# Patient Record
Sex: Female | Born: 1965 | Race: White | Hispanic: No | Smoking: Never smoker
Health system: Southern US, Community
[De-identification: ages and names within clinical notes are randomized; demographics above are authoritative.]

## PROBLEM LIST (undated history)

## (undated) DIAGNOSIS — T8859XA Other complications of anesthesia, initial encounter: Secondary | ICD-10-CM

## (undated) DIAGNOSIS — N649 Disorder of breast, unspecified: Secondary | ICD-10-CM

## (undated) DIAGNOSIS — Z9221 Personal history of antineoplastic chemotherapy: Secondary | ICD-10-CM

## (undated) DIAGNOSIS — Z923 Personal history of irradiation: Secondary | ICD-10-CM

## (undated) DIAGNOSIS — E079 Disorder of thyroid, unspecified: Secondary | ICD-10-CM

## (undated) DIAGNOSIS — E785 Hyperlipidemia, unspecified: Secondary | ICD-10-CM

## (undated) DIAGNOSIS — D649 Anemia, unspecified: Secondary | ICD-10-CM

## (undated) DIAGNOSIS — F419 Anxiety disorder, unspecified: Secondary | ICD-10-CM

## (undated) DIAGNOSIS — N951 Menopausal and female climacteric states: Secondary | ICD-10-CM

## (undated) DIAGNOSIS — I499 Cardiac arrhythmia, unspecified: Secondary | ICD-10-CM

## (undated) DIAGNOSIS — E039 Hypothyroidism, unspecified: Secondary | ICD-10-CM

## (undated) DIAGNOSIS — T4145XA Adverse effect of unspecified anesthetic, initial encounter: Secondary | ICD-10-CM

## (undated) DIAGNOSIS — B001 Herpesviral vesicular dermatitis: Secondary | ICD-10-CM

## (undated) DIAGNOSIS — I472 Ventricular tachycardia, unspecified: Secondary | ICD-10-CM

## (undated) DIAGNOSIS — R9389 Abnormal findings on diagnostic imaging of other specified body structures: Principal | ICD-10-CM

## (undated) DIAGNOSIS — Z95810 Presence of automatic (implantable) cardiac defibrillator: Secondary | ICD-10-CM

## (undated) DIAGNOSIS — C801 Malignant (primary) neoplasm, unspecified: Secondary | ICD-10-CM

## (undated) DIAGNOSIS — R232 Flushing: Secondary | ICD-10-CM

## (undated) DIAGNOSIS — N926 Irregular menstruation, unspecified: Secondary | ICD-10-CM

## (undated) DIAGNOSIS — C50919 Malignant neoplasm of unspecified site of unspecified female breast: Secondary | ICD-10-CM

## (undated) DIAGNOSIS — Z853 Personal history of malignant neoplasm of breast: Secondary | ICD-10-CM

## (undated) HISTORY — DX: Personal history of malignant neoplasm of breast: Z85.3

## (undated) HISTORY — DX: Abnormal findings on diagnostic imaging of other specified body structures: R93.89

## (undated) HISTORY — PX: TUBAL LIGATION: SHX77

## (undated) HISTORY — DX: Ventricular tachycardia: I47.2

## (undated) HISTORY — DX: Menopausal and female climacteric states: N95.1

## (undated) HISTORY — PX: TONSILLECTOMY: SUR1361

## (undated) HISTORY — DX: Herpesviral vesicular dermatitis: B00.1

## (undated) HISTORY — DX: Flushing: R23.2

## (undated) HISTORY — DX: Disorder of breast, unspecified: N64.9

## (undated) HISTORY — DX: Irregular menstruation, unspecified: N92.6

## (undated) HISTORY — PX: THYROIDECTOMY: SHX17

## (undated) HISTORY — DX: Hyperlipidemia, unspecified: E78.5

## (undated) HISTORY — PX: LEG SURGERY: SHX1003

## (undated) HISTORY — DX: Disorder of thyroid, unspecified: E07.9

## (undated) HISTORY — DX: Ventricular tachycardia, unspecified: I47.20

## (undated) HISTORY — PX: CARDIAC DEFIBRILLATOR PLACEMENT: SHX171

---

## 1997-07-07 DIAGNOSIS — Z9221 Personal history of antineoplastic chemotherapy: Secondary | ICD-10-CM

## 1997-07-07 DIAGNOSIS — Z923 Personal history of irradiation: Secondary | ICD-10-CM

## 1997-07-07 HISTORY — PX: BREAST BIOPSY: SHX20

## 1997-07-07 HISTORY — DX: Personal history of antineoplastic chemotherapy: Z92.21

## 1997-07-07 HISTORY — DX: Personal history of irradiation: Z92.3

## 1997-07-07 HISTORY — PX: BREAST LUMPECTOMY: SHX2

## 2002-05-20 ENCOUNTER — Ambulatory Visit (HOSPITAL_COMMUNITY): Admission: RE | Admit: 2002-05-20 | Discharge: 2002-05-20 | Payer: Self-pay | Admitting: Specialist

## 2002-05-20 ENCOUNTER — Encounter: Payer: Self-pay | Admitting: Specialist

## 2007-09-09 ENCOUNTER — Ambulatory Visit (HOSPITAL_COMMUNITY): Admission: RE | Admit: 2007-09-09 | Discharge: 2007-09-09 | Payer: Self-pay | Admitting: Obstetrics & Gynecology

## 2007-09-09 ENCOUNTER — Other Ambulatory Visit: Admission: RE | Admit: 2007-09-09 | Discharge: 2007-09-09 | Payer: Self-pay | Admitting: Obstetrics and Gynecology

## 2008-11-15 ENCOUNTER — Other Ambulatory Visit: Admission: RE | Admit: 2008-11-15 | Discharge: 2008-11-15 | Payer: Self-pay | Admitting: Obstetrics and Gynecology

## 2009-11-16 ENCOUNTER — Other Ambulatory Visit: Admission: RE | Admit: 2009-11-16 | Discharge: 2009-11-16 | Payer: Self-pay | Admitting: Obstetrics and Gynecology

## 2014-08-16 ENCOUNTER — Emergency Department (HOSPITAL_COMMUNITY)
Admission: EM | Admit: 2014-08-16 | Discharge: 2014-08-16 | Disposition: A | Discharge status: Home / Self Care | Payer: BLUE CROSS/BLUE SHIELD | Attending: Emergency Medicine | Admitting: Emergency Medicine

## 2014-08-16 ENCOUNTER — Encounter (HOSPITAL_COMMUNITY): Payer: Self-pay | Admitting: Emergency Medicine

## 2014-08-16 DIAGNOSIS — Y998 Other external cause status: Secondary | ICD-10-CM | POA: Diagnosis not present

## 2014-08-16 DIAGNOSIS — Z79899 Other long term (current) drug therapy: Secondary | ICD-10-CM | POA: Insufficient documentation

## 2014-08-16 DIAGNOSIS — Z853 Personal history of malignant neoplasm of breast: Secondary | ICD-10-CM | POA: Insufficient documentation

## 2014-08-16 DIAGNOSIS — S79912A Unspecified injury of left hip, initial encounter: Secondary | ICD-10-CM | POA: Diagnosis present

## 2014-08-16 DIAGNOSIS — S76012A Strain of muscle, fascia and tendon of left hip, initial encounter: Secondary | ICD-10-CM

## 2014-08-16 DIAGNOSIS — X58XXXA Exposure to other specified factors, initial encounter: Secondary | ICD-10-CM | POA: Diagnosis not present

## 2014-08-16 DIAGNOSIS — Y9389 Activity, other specified: Secondary | ICD-10-CM | POA: Diagnosis not present

## 2014-08-16 DIAGNOSIS — Y9289 Other specified places as the place of occurrence of the external cause: Secondary | ICD-10-CM | POA: Diagnosis not present

## 2014-08-16 HISTORY — DX: Malignant neoplasm of unspecified site of unspecified female breast: C50.919

## 2014-08-16 HISTORY — DX: Malignant (primary) neoplasm, unspecified: C80.1

## 2014-08-16 MED ORDER — OXYCODONE-ACETAMINOPHEN 5-325 MG PO TABS: 1.0000 | ORAL_TABLET | ORAL | Status: DC | PRN

## 2014-08-16 MED ORDER — KETOROLAC TROMETHAMINE 60 MG/2ML IM SOLN
60.0000 mg | Freq: Once | INTRAMUSCULAR | Status: AC
  Administered 2014-08-16: 60 mg via INTRAMUSCULAR
  Filled 2014-08-16: qty 2

## 2014-08-16 MED ORDER — ONDANSETRON HCL 4 MG PO TABS: 4.0000 mg | ORAL_TABLET | Freq: Four times a day (QID) | ORAL | Status: DC

## 2014-08-16 NOTE — Discharge Instructions (Signed)
Hip Pain Your hip is the joint between your upper legs and your lower pelvis. The bones, cartilage, tendons, and muscles of your hip joint perform a lot of work each day supporting your body weight and allowing you to move around. Hip pain can range from a minor ache to severe pain in one or both of your hips. Pain may be felt on the inside of the hip joint near the groin, or the outside near the buttocks and upper thigh. You may have swelling or stiffness as well.  HOME CARE INSTRUCTIONS   Take medicines only as directed by your health care provider.  Apply ice to the injured area:  Put ice in a plastic bag.  Place a towel between your skin and the bag.  Leave the ice on for 15-20 minutes at a time, 3-4 times a day.  Keep your leg raised (elevated) when possible to lessen swelling.  Avoid activities that cause pain.  Follow specific exercises as directed by your health care provider.  Sleep with a pillow between your legs on your most comfortable side.  Record how often you have hip pain, the location of the pain, and what it feels like. SEEK MEDICAL CARE IF:   You are unable to put weight on your leg.  Your hip is red or swollen or very tender to touch.  Your pain or swelling continues or worsens after 1 week.  You have increasing difficulty walking.  You have a fever. SEEK IMMEDIATE MEDICAL CARE IF:   You have fallen.  You have a sudden increase in pain and swelling in your hip. MAKE SURE YOU:   Understand these instructions.  Will watch your condition.  Will get help right away if you are not doing well or get worse. Document Released: 12/11/2009 Document Revised: 11/07/2013 Document Reviewed: 02/17/2013 ExitCare Patient Information 2015 [employer]. This information is not intended to replace advice given to you by your health care provider. Make sure you discuss any questions you have with your health care provider.  

## 2014-08-16 NOTE — ED Notes (Signed)
Pt reports left hip/buttock pain x2 weeks. Pt reports "pulled a muscle" while completing an exercise video. nad noted.

## 2014-08-18 NOTE — ED Provider Notes (Signed)
CSN: [id number]     Arrival date & time 08/16/14  0746 History   First MD Initiated Contact with Patient 08/16/14 [phone]     Chief Complaint  Patient presents with  . Hip Pain     (Consider location/radiation/quality/duration/timing/severity/associated sxs/prior Treatment) HPI   Tina Middleton is a 49 y.o. female who presents to the Emergency Department complaining of left hip and buttock pain for 2 weeks.  She states that she was exercising and felt a pulling sensation to her posterior hip and buttock.  Pain is worse with weight bearing and certain movements.   She was seen by her PMD for same and given muscle relaxer and prednisone which she states has not helped the pain.  She denies fall, swelling, numbness or weakness of the lower extremities, urinary or bowel changes or abdominal pain.     Past Medical History  Diagnosis Date  . Cancer   . Breast cancer    Past Surgical History  Procedure Laterality Date  . Cardiac defibrillator placement    . Thyroidectomy    . Tonsillectomy    . Tubal ligation    . Cesarean section     History reviewed. No pertinent family history. History  Substance Use Topics  . Smoking status: Never Smoker   . Smokeless tobacco: Not on file  . Alcohol Use: Not on file     Comment: occasional   OB History    No data available     Review of Systems  Constitutional: Negative for fever.  Respiratory: Negative for shortness of breath.   Gastrointestinal: Negative for vomiting, abdominal pain and constipation.  Genitourinary: Negative for dysuria, hematuria, flank pain, decreased urine volume and difficulty urinating.  Musculoskeletal: Negative for joint swelling.       Pain left posterior hip and buttock  Skin: Negative for rash.  Neurological: Negative for weakness and numbness.  All other systems reviewed and are negative.     Allergies  Aspirin  Home Medications   Prior to Admission medications   Medication Sig Start Date End Date  Taking? Authorizing Provider  ALPRAZolam Duanne Moron) 0.5 MG tablet Take 0.5 mg by mouth 2 (two) times daily as needed. 08/11/14  Yes Historical Provider, MD  Fe-Succ Ac-C-Thre Ac-B12-FA (FERREX 150 FORTE PLUS) 50-100 MG CAPS Take 1 capsule by mouth daily. 06/20/14  Yes Historical Provider, MD  fenofibrate 160 MG tablet Take 160 mg by mouth at bedtime. 05/28/14  Yes Historical Provider, MD  levothyroxine (SYNTHROID, LEVOTHROID) 112 MCG tablet Take 112 mcg by mouth daily. 08/14/14  Yes Historical Provider, MD  methocarbamol (ROBAXIN) 750 MG tablet Take 750 mg by mouth every 6 (six) hours as needed. 08/11/14  Yes Historical Provider, MD  metoprolol tartrate (LOPRESSOR) 25 MG tablet Take 25 mg by mouth 2 (two) times daily. 08/14/14  Yes Historical Provider, MD  potassium chloride SA (K-DUR,KLOR-CON) 20 MEQ tablet Take 20 mEq by mouth daily. 05/28/14  Yes Historical Provider, MD  predniSONE (STERAPRED UNI-PAK) 10 MG tablet Take 1 tablet by mouth AC breakfast. 08/11/14  Yes Historical Provider, MD  sotalol (BETAPACE) 120 MG tablet Take 180 mg by mouth 2 (two) times daily. 08/06/14  Yes Historical Provider, MD  zolpidem (AMBIEN) 10 MG tablet Take 10 mg by mouth at bedtime as needed. 06/20/14  Yes Historical Provider, MD  ondansetron (ZOFRAN) 4 MG tablet Take 1 tablet (4 mg total) by mouth every 6 (six) hours. 08/16/14   Calix Heinbaugh L. Orlander Norwood, PA-C  oxyCODONE-acetaminophen (PERCOCET/ROXICET) 5-325 MG per  tablet Take 1 tablet by mouth every 4 (four) hours as needed. 08/16/14   Edna Rede L. Conchetta Lamia, PA-C   BP 117/69 mmHg  Pulse 80  Temp(Src) 98 F (36.7 C) (Oral)  Resp 18  Ht 5\' 6"  (1.676 m)  Wt 160 lb (72.576 kg)  BMI 25.84 kg/m2  SpO2 100%  LMP 07/23/2014 Physical Exam  Constitutional: She is oriented to person, place, and time. She appears well-developed and well-nourished. No distress.  HENT:  Head: Normocephalic and atraumatic.  Cardiovascular: Normal rate, regular rhythm, normal heart sounds and intact distal  pulses.   No murmur heard. Pulmonary/Chest: Effort normal and breath sounds normal. No respiratory distress.  Abdominal: Soft. She exhibits no distension. There is no tenderness. There is no rebound and no guarding.  Musculoskeletal: She exhibits tenderness. She exhibits no edema.       Lumbar back: She exhibits tenderness and pain. She exhibits normal range of motion, no swelling, no deformity, no laceration and normal pulse.  ttp of the left SI joint.  No spinal tenderness.  DP pulses are brisk and symmetrical.  Distal sensation intact.  Pain reproduced with internal rotation of left hip.  Pt has 5/5 strength against resistance of bilateral lower extremities.     Neurological: She is alert and oriented to person, place, and time. She has normal strength. No sensory deficit. She exhibits normal muscle tone. Coordination and gait normal.  Reflex Scores:      Patellar reflexes are 2+ on the right side and 2+ on the left side.      Achilles reflexes are 2+ on the right side and 2+ on the left side. Skin: Skin is warm and dry. No rash noted.  Nursing note and vitals reviewed.   ED Course  Procedures (including critical care time) Labs Review Labs Reviewed - No data to display  Imaging Review No results found.   EKG Interpretation None      MDM   Final diagnoses:  Hip strain, left, initial encounter   Pt is ambulatory, no focal neuro deficits.  Pain to left SI joint space.  NV intact.  No concerning sx's for septic joint.  Sx's likely related to strain.  Pt feeling better after medication,  Appears stable for d/c.  She agrees to close f/u with her PMD this week and advised to return here for any worsening symptoms    Tremel Setters L. Ammie Ferrier 08/18/14 2209  Fredia Sorrow, MD 08/19/14 1041

## 2015-10-29 ENCOUNTER — Other Ambulatory Visit (HOSPITAL_COMMUNITY)
Admission: RE | Admit: 2015-10-29 | Discharge: 2015-10-29 | Disposition: A | Discharge status: Home / Self Care | Payer: BLUE CROSS/BLUE SHIELD | Source: Ambulatory Visit | Attending: Adult Health | Admitting: Adult Health

## 2015-10-29 ENCOUNTER — Ambulatory Visit (INDEPENDENT_AMBULATORY_CARE_PROVIDER_SITE_OTHER): Payer: BLUE CROSS/BLUE SHIELD | Admitting: Adult Health

## 2015-10-29 ENCOUNTER — Encounter: Payer: Self-pay | Admitting: Adult Health

## 2015-10-29 VITALS — BP 110/72 | HR 62 | Ht 66.0 in | Wt 164.5 lb

## 2015-10-29 DIAGNOSIS — N926 Irregular menstruation, unspecified: Secondary | ICD-10-CM | POA: Insufficient documentation

## 2015-10-29 DIAGNOSIS — Z1212 Encounter for screening for malignant neoplasm of rectum: Secondary | ICD-10-CM | POA: Diagnosis not present

## 2015-10-29 DIAGNOSIS — Z853 Personal history of malignant neoplasm of breast: Secondary | ICD-10-CM

## 2015-10-29 DIAGNOSIS — Z1151 Encounter for screening for human papillomavirus (HPV): Secondary | ICD-10-CM | POA: Diagnosis present

## 2015-10-29 DIAGNOSIS — Z01419 Encounter for gynecological examination (general) (routine) without abnormal findings: Secondary | ICD-10-CM

## 2015-10-29 DIAGNOSIS — N951 Menopausal and female climacteric states: Secondary | ICD-10-CM

## 2015-10-29 DIAGNOSIS — R232 Flushing: Secondary | ICD-10-CM

## 2015-10-29 HISTORY — DX: Menopausal and female climacteric states: N95.1

## 2015-10-29 HISTORY — DX: Flushing: R23.2

## 2015-10-29 HISTORY — DX: Irregular menstruation, unspecified: N92.6

## 2015-10-29 HISTORY — DX: Personal history of malignant neoplasm of breast: Z85.3

## 2015-10-29 LAB — HEMOCCULT GUIAC POC 1CARD (OFFICE): FECAL OCCULT BLD: NEGATIVE

## 2015-10-29 NOTE — Progress Notes (Signed)
Patient ID: Tina Middleton, female   DOB: Jun 01, 1966, 50 y.o.   MRN: [id number] History of Present Illness: Tina Middleton is a 50 year old white female, in for a well woman gyn exam and pap.She has had breast cancer and has a cardiac defibrillator.She is having irregular periods, only 2 in last year and hot flashes and wakes up at night. She works at [employer]	[place].  Cardiologist is Dr Alroy Dust in [place].  Current Medications, Allergies, Past Medical History, Past Surgical History, Family History and Social History were reviewed in [employer] [facility] record.     Review of Systems: Patient denies any headaches, hearing loss, fatigue, blurred vision, shortness of breath, chest pain, abdominal pain, problems with bowel movements, urination, or intercourse. No joint pain or mood swings.See HPI for positives.    Physical Exam:BP 110/72 mmHg  Pulse 62  Ht 5\' 6"  (1.676 m)  Wt 164 lb 8 oz (74.617 kg)  BMI 26.56 kg/m2  LMP 10/02/2015 General:  Well developed, well nourished, no acute distress Skin:  Warm and dry Neck:  Midline trachea, normal thyroid, good ROM, no lymphadenopathy Lungs; Clear to auscultation bilaterally Breast:  No dominant palpable mass, retraction, or nipple discharge Cardiovascular: Regular rate and rhythm Abdomen:  Soft, non tender, no hepatosplenomegaly Pelvic:  External genitalia is normal in appearance, no lesions.  The vagina is normal in appearance. Urethra has no lesions or masses. The cervix is smooth, pap with HPV performed.  Uterus is felt to be normal size, shape, and contour.  No adnexal masses or tenderness noted.Bladder is non tender, no masses felt. Rectal: Good sphincter tone, no polyps, or hemorrhoids felt.  Hemoccult negative. Extremities/musculoskeletal:  No swelling or varicosities noted, no clubbing or cyanosis Psych:  No mood changes, alert and cooperative,seems happy Discussed menopause and that she is not candidate for HRT, but could use SSRI if  needed for hot flashes.  Impression: Well woman gyn exam and pap. Perimenopause Hot flashes Irregular periods History of breast cancer    Plan: Labs with cardiology Physical in 1 year, pap in 3 if normal Mammogram yearly Colonoscopy per GI Review handout on peri menopause

## 2015-10-29 NOTE — Patient Instructions (Signed)
Physical in 1 year Mammogram yearly Colonoscopy per GI Labs with cardiology  Perimenopause Perimenopause is the time when your body begins to move into the menopause (no menstrual period for 12 straight months). It is a natural process. Perimenopause can begin 2-8 years before the menopause and usually lasts for 1 year after the menopause. During this time, your ovaries may or may not produce an egg. The ovaries vary in their production of estrogen and progesterone hormones each month. This can cause irregular menstrual periods, difficulty getting pregnant, vaginal bleeding between periods, and uncomfortable symptoms. CAUSES  Irregular production of the ovarian hormones, estrogen and progesterone, and not ovulating every month.  Other causes include:  Tumor of the pituitary gland in the brain.  Medical disease that affects the ovaries.  Radiation treatment.  Chemotherapy.  Unknown causes.  Heavy smoking and excessive alcohol intake can bring on perimenopause sooner. SIGNS AND SYMPTOMS   Hot flashes.  Night sweats.  Irregular menstrual periods.  Decreased sex drive.  Vaginal dryness.  Headaches.  Mood swings.  Depression.  Memory problems.  Irritability.  Tiredness.  Weight gain.  Trouble getting pregnant.  The beginning of losing bone cells (osteoporosis).  The beginning of hardening of the arteries (atherosclerosis). DIAGNOSIS  Your health care provider will make a diagnosis by analyzing your age, menstrual history, and symptoms. He or she will do a physical exam and note any changes in your body, especially your female organs. Female hormone tests may or may not be helpful depending on the amount of female hormones you produce and when you produce them. However, other hormone tests may be helpful to rule out other problems. TREATMENT  In some cases, no treatment is needed. The decision on whether treatment is necessary during the perimenopause should be made  by you and your health care provider based on how the symptoms are affecting you and your lifestyle. Various treatments are available, such as:  Treating individual symptoms with a specific medicine for that symptom.  Herbal medicines that can help specific symptoms.  Counseling.  Group therapy. HOME CARE INSTRUCTIONS   Keep track of your menstrual periods (when they occur, how heavy they are, how long between periods, and how long they last) as well as your symptoms and when they started.  Only take over-the-counter or prescription medicines as directed by your health care provider.  Sleep and rest.  Exercise.  Eat a diet that contains calcium (good for your bones) and soy (acts like the estrogen hormone).  Do not smoke.  Avoid alcoholic beverages.  Take vitamin supplements as recommended by your health care provider. Taking vitamin E may help in certain cases.  Take calcium and vitamin D supplements to help prevent bone loss.  Group therapy is sometimes helpful.  Acupuncture may help in some cases. SEEK MEDICAL CARE IF:   You have questions about any symptoms you are having.  You need a referral to a specialist (gynecologist, psychiatrist, or psychologist). SEEK IMMEDIATE MEDICAL CARE IF:   You have vaginal bleeding.  Your period lasts longer than 8 days.  Your periods are recurring sooner than 21 days.  You have bleeding after intercourse.  You have severe depression.  You have pain when you urinate.  You have severe headaches.  You have vision problems.   This information is not intended to replace advice given to you by your health care provider. Make sure you discuss any questions you have with your health care provider.   Document Released: 07/31/2004  Document Revised: 07/14/2014 Document Reviewed: 01/20/2013 Elsevier Interactive Patient Education [employer].

## 2015-10-30 LAB — CYTOLOGY - PAP

## 2015-11-13 ENCOUNTER — Other Ambulatory Visit: Payer: Self-pay | Admitting: Adult Health

## 2015-11-13 DIAGNOSIS — Z1231 Encounter for screening mammogram for malignant neoplasm of breast: Secondary | ICD-10-CM

## 2015-11-15 ENCOUNTER — Telehealth: Payer: Self-pay | Admitting: Adult Health

## 2015-11-15 MED ORDER — PAROXETINE MESYLATE 7.5 MG PO CAPS: ORAL_CAPSULE | ORAL | Status: DC

## 2015-11-15 NOTE — Telephone Encounter (Signed)
Pt called stating that Tina Middleton would prescribe her a medication for her hot flashes. Pt states that she would like a call back from [place].

## 2015-11-15 NOTE — Telephone Encounter (Signed)
Left message to call me back about meds

## 2015-11-15 NOTE — Addendum Note (Signed)
Addended by: Derrek Monaco A on: 11/15/2015 04:57 PM   Modules accepted: Orders

## 2015-11-15 NOTE — Telephone Encounter (Signed)
Wants to try brisdelle, 42 tabs given 1 daily

## 2015-11-26 ENCOUNTER — Ambulatory Visit (HOSPITAL_COMMUNITY): Payer: BLUE CROSS/BLUE SHIELD

## 2016-03-20 ENCOUNTER — Ambulatory Visit (HOSPITAL_COMMUNITY): Payer: BLUE CROSS/BLUE SHIELD

## 2016-03-24 ENCOUNTER — Ambulatory Visit (HOSPITAL_COMMUNITY): Payer: BLUE CROSS/BLUE SHIELD

## 2016-03-25 ENCOUNTER — Telehealth: Payer: Self-pay | Admitting: *Deleted

## 2016-03-25 NOTE — Telephone Encounter (Signed)
Left message advising pt to call and schedule an appt to discuss right side pain. [place]

## 2016-03-31 ENCOUNTER — Ambulatory Visit (INDEPENDENT_AMBULATORY_CARE_PROVIDER_SITE_OTHER): Payer: BLUE CROSS/BLUE SHIELD

## 2016-03-31 ENCOUNTER — Encounter: Payer: Self-pay | Admitting: Women's Health

## 2016-03-31 ENCOUNTER — Ambulatory Visit (INDEPENDENT_AMBULATORY_CARE_PROVIDER_SITE_OTHER): Payer: BLUE CROSS/BLUE SHIELD | Admitting: Adult Health

## 2016-03-31 VITALS — BP 110/70 | HR 60 | Ht 66.0 in | Wt 174.0 lb

## 2016-03-31 DIAGNOSIS — N951 Menopausal and female climacteric states: Secondary | ICD-10-CM

## 2016-03-31 DIAGNOSIS — N854 Malposition of uterus: Secondary | ICD-10-CM

## 2016-03-31 DIAGNOSIS — N926 Irregular menstruation, unspecified: Secondary | ICD-10-CM | POA: Diagnosis not present

## 2016-03-31 DIAGNOSIS — R232 Flushing: Secondary | ICD-10-CM

## 2016-03-31 DIAGNOSIS — D252 Subserosal leiomyoma of uterus: Secondary | ICD-10-CM | POA: Diagnosis not present

## 2016-03-31 DIAGNOSIS — N838 Other noninflammatory disorders of ovary, fallopian tube and broad ligament: Secondary | ICD-10-CM

## 2016-03-31 DIAGNOSIS — R1031 Right lower quadrant pain: Secondary | ICD-10-CM

## 2016-03-31 DIAGNOSIS — Z853 Personal history of malignant neoplasm of breast: Secondary | ICD-10-CM | POA: Diagnosis not present

## 2016-03-31 MED ORDER — PAROXETINE MESYLATE 7.5 MG PO CAPS: ORAL_CAPSULE | ORAL | 0 refills | Status: DC

## 2016-03-31 NOTE — Progress Notes (Signed)
Subjective:     Patient ID: Tina Middleton, female   DOB: [birth year], 50 y.o.   MRN: [id number]  HPI Aidana is a 50 year old white female in complaining of pain in RLQ since 2:30 am today,may be sharp to nagging.periods irregular and she stopped bresdelle,(had weird dreams at times) and hot flashes are 3 x as bad.She has history of breast cancer.She has chronic constipation.   Review of Systems + pain in RLQ Constipation (chronic) Irregular periods Hot flashes  Hx breast cancer   Reviewed past medical,surgical, social and family history. Reviewed medications and allergies.     Objective:   Physical Exam BP 110/70   Pulse 60   Ht 5\' 6"  (1.676 m)   Wt 174 lb (78.9 kg)   LMP 10/02/2015   BMI 28.08 kg/m    Skin warm and dry, Abdomen is soft and non tender, no HSM.Pelvic: external genitalia is normal in appearance no lesions, vagina: normal in appearence,urethra has no lesions or masses noted, cervix:smooth and bulbous, uterus: normal size, shape and contour, non tender, no masses felt, adnexa: no masses or tenderness noted at present. Bladder is non tender and no masses felt. PHQ 2 score 0. Will get [place] to assess RLQ pain,ASAP  Assessment:     1. RLQ abdominal pain   2. Irregular periods   3. Peri-menopausal   4. Hot flashes   5. History of breast cancer in female       Plan:     Given 28 tabs of bresdelle 7.5 mg to start back on 1 daily Return this afternoon for GYN [place] to assess RLQ pain

## 2016-03-31 NOTE — Progress Notes (Signed)
PELVIC US TA/TV: Heterogeneous anteverted uterus w/anterior rt subserosal fibroid 1.4 x 1.4 x 1.4 cm,limited view of endometrium 9.5 mm,normal lt ov, simple lt adnexal cyst separate from lt ov 1.8 x 1.2 x 1.1 cm,normal right ov,no free fluid,no pain during ultrasound

## 2016-04-02 ENCOUNTER — Ambulatory Visit (HOSPITAL_COMMUNITY)
Admission: RE | Admit: 2016-04-02 | Discharge: 2016-04-02 | Disposition: A | Discharge status: Home / Self Care | Payer: BLUE CROSS/BLUE SHIELD | Source: Ambulatory Visit | Attending: Adult Health | Admitting: Adult Health

## 2016-04-02 ENCOUNTER — Encounter (HOSPITAL_COMMUNITY): Payer: Self-pay | Admitting: Radiology

## 2016-04-02 DIAGNOSIS — Z1231 Encounter for screening mammogram for malignant neoplasm of breast: Secondary | ICD-10-CM | POA: Insufficient documentation

## 2016-04-07 ENCOUNTER — Telehealth: Payer: Self-pay | Admitting: Adult Health

## 2016-04-07 NOTE — Telephone Encounter (Signed)
Left message x 1. JSY 

## 2016-04-09 NOTE — Telephone Encounter (Signed)
Left message to call me back about [place]

## 2016-04-09 NOTE — Telephone Encounter (Signed)
Left message x 2. JSY 

## 2016-04-10 ENCOUNTER — Telehealth: Payer: Self-pay | Admitting: Adult Health

## 2016-04-10 ENCOUNTER — Telehealth: Payer: Self-pay | Admitting: *Deleted

## 2016-04-10 MED ORDER — PROGESTERONE MICRONIZED 200 MG PO CAPS: ORAL_CAPSULE | ORAL | 3 refills | Status: DC

## 2016-04-10 NOTE — Telephone Encounter (Signed)
Message left that I called her back

## 2016-04-10 NOTE — Telephone Encounter (Signed)
Pt aware of Dr Brynda Greathouse recommendation of periodic every 3 month shedding with prometrium 200 mg days 1-10. And repeat US in 3 months, appt made

## 2016-04-10 NOTE — Telephone Encounter (Signed)
JAG spoke with pt. Tina Middleton 

## 2016-04-10 NOTE — Telephone Encounter (Signed)
She wanted to verify taking Prometrium every 3 months

## 2016-04-16 ENCOUNTER — Ambulatory Visit: Payer: BLUE CROSS/BLUE SHIELD | Admitting: Adult Health

## 2016-04-22 ENCOUNTER — Telehealth: Payer: Self-pay | Admitting: Adult Health

## 2016-04-22 NOTE — Telephone Encounter (Signed)
Spoke with pt. Pt called requesting samples of Brisdelle. I gave 9 sample boxes. Lot # [id number] exp 05/06/16. Pt to pick up at front desk. [place]

## 2016-04-22 NOTE — Telephone Encounter (Signed)
Left message x 1. JSY 

## 2016-06-26 ENCOUNTER — Telehealth: Payer: Self-pay | Admitting: *Deleted

## 2016-06-26 NOTE — Telephone Encounter (Signed)
Left message x 1. JSY 

## 2016-06-28 MED ORDER — PAROXETINE MESYLATE 7.5 MG PO CAPS: ORAL_CAPSULE | ORAL | 2 refills | Status: DC

## 2016-06-28 NOTE — Telephone Encounter (Signed)
Will refill brisdelle

## 2016-07-10 ENCOUNTER — Other Ambulatory Visit: Payer: Self-pay | Admitting: Adult Health

## 2016-07-10 DIAGNOSIS — N83201 Unspecified ovarian cyst, right side: Secondary | ICD-10-CM

## 2016-07-11 ENCOUNTER — Ambulatory Visit (INDEPENDENT_AMBULATORY_CARE_PROVIDER_SITE_OTHER): Payer: BLUE CROSS/BLUE SHIELD

## 2016-07-11 DIAGNOSIS — N83202 Unspecified ovarian cyst, left side: Secondary | ICD-10-CM

## 2016-07-11 DIAGNOSIS — D252 Subserosal leiomyoma of uterus: Secondary | ICD-10-CM

## 2016-07-11 DIAGNOSIS — N83201 Unspecified ovarian cyst, right side: Secondary | ICD-10-CM

## 2016-07-11 DIAGNOSIS — N854 Malposition of uterus: Secondary | ICD-10-CM | POA: Diagnosis not present

## 2016-07-11 NOTE — Progress Notes (Signed)
PELVIC US TA/TV: heterogeneous anteverted uterus w/ a anterior fundal right subserosal fibroid 1.8 x 1.4 x 1.7 cm,EEC 4.6 mm,normal ovaries bilat,simple left adnexal cyst adjacent to left ovary 1.6 x 1 x 1.3 cm (N/C),no free fluid,no pain during ultrasound,ovaries appear mobile.

## 2016-07-17 ENCOUNTER — Telehealth: Payer: Self-pay | Admitting: *Deleted

## 2016-07-17 ENCOUNTER — Telehealth: Payer: Self-pay | Admitting: Adult Health

## 2016-07-17 NOTE — Telephone Encounter (Signed)
Left message x 1. JSY 

## 2016-07-17 NOTE — Telephone Encounter (Signed)
Spoke with pt. Pt had an [place] that was ok per JAG. Pt takes Progesterone every 3 months. She took it in November. Pt wonders does she continue since [place] was normal. I spoke with JAG and she advised to continue taking Progesterone. Pt voiced understanding. [place]

## 2016-07-17 NOTE — Telephone Encounter (Signed)
Left message that still has simple left adnexal cyst, with no change, Dr Glo Herring says no worry but Dr Elonda Husky has not read [place] yet, if any difference will let you know.uterus still has fibroid

## 2016-10-02 ENCOUNTER — Other Ambulatory Visit: Payer: Self-pay | Admitting: Adult Health

## 2016-11-03 ENCOUNTER — Ambulatory Visit (INDEPENDENT_AMBULATORY_CARE_PROVIDER_SITE_OTHER): Payer: BLUE CROSS/BLUE SHIELD | Admitting: Adult Health

## 2016-11-03 ENCOUNTER — Encounter (INDEPENDENT_AMBULATORY_CARE_PROVIDER_SITE_OTHER): Payer: Self-pay

## 2016-11-03 ENCOUNTER — Encounter: Payer: Self-pay | Admitting: Adult Health

## 2016-11-03 VITALS — BP 104/66 | HR 60 | Ht 66.75 in | Wt 178.0 lb

## 2016-11-03 DIAGNOSIS — Z01419 Encounter for gynecological examination (general) (routine) without abnormal findings: Secondary | ICD-10-CM | POA: Diagnosis not present

## 2016-11-03 DIAGNOSIS — Z1211 Encounter for screening for malignant neoplasm of colon: Secondary | ICD-10-CM | POA: Diagnosis not present

## 2016-11-03 DIAGNOSIS — Z853 Personal history of malignant neoplasm of breast: Secondary | ICD-10-CM

## 2016-11-03 DIAGNOSIS — Z1212 Encounter for screening for malignant neoplasm of rectum: Secondary | ICD-10-CM | POA: Diagnosis not present

## 2016-11-03 DIAGNOSIS — R232 Flushing: Secondary | ICD-10-CM

## 2016-11-03 DIAGNOSIS — N951 Menopausal and female climacteric states: Secondary | ICD-10-CM

## 2016-11-03 LAB — HEMOCCULT GUIAC POC 1CARD (OFFICE): FECAL OCCULT BLD: NEGATIVE

## 2016-11-03 MED ORDER — PAROXETINE MESYLATE 7.5 MG PO CAPS: 1.0000 | ORAL_CAPSULE | Freq: Every day | ORAL | 4 refills | Status: DC

## 2016-11-03 NOTE — Progress Notes (Addendum)
Patient ID: Tina Middleton, female   DOB: September 27, 1965, 51 y.o.   MRN: [id number] History of Present Illness: Tina Middleton is a 51 year old white female in for well woman gyn exam, she had normal pap with negative HPV 10/29/15.She cut her little finger on left hand last night and had to get stitches. She is on Brisdelle and much better, has had some spotting after Prometrium but no heavy bleeding.She is still working at [employer]	[place].She has history of breast cancer.   Current Medications, Allergies, Past Medical History, Past Surgical History, Family History and Social History were reviewed in [employer] record.     Review of Systems: Patient denies any headaches, hearing loss, fatigue, blurred vision, shortness of breath, chest pain, abdominal pain, problems with bowel movements, urination, or intercourse. No joint pain or mood swings.Hot flashes much better.    Physical Exam:BP 104/66 (BP Location: Left Arm, Patient Position: Sitting, Cuff Size: Normal)   Pulse 60   Ht 5' 6.75" (1.695 m)   Wt 178 lb (80.7 kg)   BMI 28.09 kg/m  General:  Well developed, well nourished, no acute distress Skin:  Warm and dry Neck:  Midline trachea, normal thyroid, good ROM, no lymphadenopathy Lungs; Clear to auscultation bilaterally Breast:  No dominant palpable mass, retraction, or nipple discharge Cardiovascular: Regular rate and rhythm Abdomen:  Soft, non tender, no hepatosplenomegaly Pelvic:  External genitalia is normal in appearance, no lesions.  The vagina is normal in appearance. Urethra has no lesions or masses. The cervix is smooth.  Uterus is felt to be normal size, shape, and contour.  No adnexal masses or tenderness noted.Bladder is non tender, no masses felt. Rectal: Good sphincter tone, no polyps, or hemorrhoids felt.  Hemoccult negative. Extremities/musculoskeletal:  No swelling or varicosities noted, no clubbing or cyanosis Psych:  No mood changes, alert and cooperative,seems  happy PHQ 2 score 0.  Impression: 1. Well woman exam with routine gynecological exam   2. Screening for colorectal cancer   3. Hot flashes   4. Peri-menopausal   5.       History of breast cancer     Plan: Refilled brisdelle x 1 year Physical in 1 year Pap in 2020  Continue Prometrium every 3 months, can stop when no bleeding at all and get [place] next year  Mammogram yearly

## 2016-11-27 ENCOUNTER — Telehealth: Payer: Self-pay | Admitting: Adult Health

## 2016-11-27 MED ORDER — METRONIDAZOLE 500 MG PO TABS: 500.0000 mg | ORAL_TABLET | Freq: Two times a day (BID) | ORAL | 0 refills | Status: DC

## 2016-11-27 NOTE — Telephone Encounter (Signed)
LMOVM to return call.

## 2016-11-27 NOTE — Telephone Encounter (Signed)
Pt thinks she has BV, rx flagyl

## 2016-11-27 NOTE — Telephone Encounter (Signed)
Patient called stating she is certain she has BV; Itching, odor, white discharge. Can you send something? Please advise.

## 2016-12-23 ENCOUNTER — Telehealth: Payer: Self-pay | Admitting: Adult Health

## 2016-12-23 MED ORDER — ACYCLOVIR 400 MG PO TABS: 400.0000 mg | ORAL_TABLET | Freq: Two times a day (BID) | ORAL | 1 refills | Status: DC

## 2016-12-23 MED ORDER — PENCICLOVIR 1 % EX CREA: 1.0000 "application " | TOPICAL_CREAM | CUTANEOUS | 0 refills | Status: DC

## 2016-12-23 NOTE — Telephone Encounter (Signed)
Pt aware that meds Rx sent to [facility]

## 2016-12-30 ENCOUNTER — Telehealth: Payer: Self-pay | Admitting: Adult Health

## 2016-12-30 NOTE — Telephone Encounter (Signed)
Pt called and left a message stating that Tina Middleton has prescribed her a cream to apply for fever blisters, Patient states that the cream cost 150 dollars with insurance and 900 without. Pt states that this is way to expensive. Please contact pt

## 2016-12-31 NOTE — Telephone Encounter (Signed)
Left message I called 

## 2017-05-13 ENCOUNTER — Other Ambulatory Visit: Payer: Self-pay | Admitting: Adult Health

## 2017-05-13 DIAGNOSIS — Z1231 Encounter for screening mammogram for malignant neoplasm of breast: Secondary | ICD-10-CM

## 2017-05-15 ENCOUNTER — Ambulatory Visit (HOSPITAL_COMMUNITY)
Admission: RE | Admit: 2017-05-15 | Discharge: 2017-05-15 | Disposition: A | Discharge status: Home / Self Care | Payer: Commercial Managed Care - PPO | Source: Ambulatory Visit | Attending: Adult Health | Admitting: Adult Health

## 2017-05-15 ENCOUNTER — Encounter (HOSPITAL_COMMUNITY): Payer: Self-pay

## 2017-05-15 DIAGNOSIS — Z1231 Encounter for screening mammogram for malignant neoplasm of breast: Secondary | ICD-10-CM

## 2017-11-06 ENCOUNTER — Other Ambulatory Visit: Payer: Self-pay | Admitting: Adult Health

## 2017-11-09 ENCOUNTER — Encounter: Payer: Self-pay | Admitting: Adult Health

## 2017-11-09 ENCOUNTER — Ambulatory Visit (INDEPENDENT_AMBULATORY_CARE_PROVIDER_SITE_OTHER): Payer: Commercial Managed Care - PPO | Admitting: Adult Health

## 2017-11-09 ENCOUNTER — Other Ambulatory Visit: Payer: Self-pay

## 2017-11-09 VITALS — BP 100/64 | HR 69 | Resp 16 | Ht 66.0 in | Wt 174.0 lb

## 2017-11-09 DIAGNOSIS — Z1211 Encounter for screening for malignant neoplasm of colon: Secondary | ICD-10-CM | POA: Diagnosis not present

## 2017-11-09 DIAGNOSIS — Z113 Encounter for screening for infections with a predominantly sexual mode of transmission: Secondary | ICD-10-CM

## 2017-11-09 DIAGNOSIS — Z1212 Encounter for screening for malignant neoplasm of rectum: Secondary | ICD-10-CM | POA: Diagnosis not present

## 2017-11-09 DIAGNOSIS — Z01411 Encounter for gynecological examination (general) (routine) with abnormal findings: Secondary | ICD-10-CM | POA: Diagnosis not present

## 2017-11-09 DIAGNOSIS — Z01419 Encounter for gynecological examination (general) (routine) without abnormal findings: Secondary | ICD-10-CM | POA: Insufficient documentation

## 2017-11-09 DIAGNOSIS — R1031 Right lower quadrant pain: Secondary | ICD-10-CM | POA: Diagnosis not present

## 2017-11-09 DIAGNOSIS — Z853 Personal history of malignant neoplasm of breast: Secondary | ICD-10-CM | POA: Diagnosis not present

## 2017-11-09 LAB — HEMOCCULT GUIAC POC 1CARD (OFFICE): FECAL OCCULT BLD: NEGATIVE

## 2017-11-09 NOTE — Progress Notes (Signed)
Patient ID: Tina Middleton, female   DOB: July 12, 1965, 52 y.o.   MRN: [id number] History of Present Illness:  Tina Middleton is a 52 year old white female, G1P1, recently broke up with live in boyfriend, he cheated, in for well woman gyn exam,she had normal pap with negative HPV 10/29/15.She is Therapist, sports at [employer].   Current Medications, Allergies, Past Medical History, Past Surgical History, Family History and Social History were reviewed in [employer] record.     Review of Systems:  Patient denies any headaches, hearing loss, fatigue, blurred vision, shortness of breath, chest pain, abdominal pain(RLQ at times), problems with bowel movements, urination, or intercourse(not currently active). No joint pain or mood swings. No bleeding from Prometrium for a year now.  Physical Exam:BP 100/64 (BP Location: Left Arm, Patient Position: Sitting, Cuff Size: Normal)   Pulse 69   Resp 16   Ht 5\' 6"  (1.676 m)   Wt 174 lb (78.9 kg)   BMI 28.08 kg/m  General:  Well developed, well nourished, no acute distress Skin:  Warm and dry Neck:  Midline trachea, normal thyroid, good ROM, no lymphadenopathy Lungs; Clear to auscultation bilaterally Breast:  No dominant palpable mass, retraction, or nipple discharge,right breast smaller sp breast cancer. Cardiovascular: Regular rate and rhythm Abdomen:  Soft, non tender, no hepatosplenomegaly Pelvic:  External genitalia is normal in appearance, no lesions.  The vagina is normal in appearance. Urethra has no lesions or masses. The cervix is bulbous.  Uterus is felt to be normal size, shape, and contour.  No adnexal masses or tenderness noted.Bladder is non tender, no masses felt.GC/CHL obtained. Rectal: Good sphincter tone, no polyps, or hemorrhoids felt.  Hemoccult negative. Extremities/musculoskeletal:  No swelling or varicosities noted, no clubbing or cyanosis Psych:  No mood changes, alert and cooperative,seems happy PHQ 2 score  0.  Impression:  1. Encounter for well woman exam with routine gynecological exam   2. Screening for colorectal cancer   3. History of breast cancer in female   4. RLQ abdominal pain   5. Screening examination for STD (sexually transmitted disease)      Plan: GC/CHL sent Return in 2 months for GYN [place] assessment of endometrium Physical in 1 year Pap in 2020 Referred to Dr Gala Romney for colonoscopy Labs at work

## 2017-11-11 ENCOUNTER — Other Ambulatory Visit: Payer: Self-pay | Admitting: *Deleted

## 2017-11-11 ENCOUNTER — Encounter (INDEPENDENT_AMBULATORY_CARE_PROVIDER_SITE_OTHER): Payer: Self-pay | Admitting: *Deleted

## 2017-11-11 ENCOUNTER — Telehealth: Payer: Self-pay | Admitting: Adult Health

## 2017-11-11 DIAGNOSIS — Z1211 Encounter for screening for malignant neoplasm of colon: Secondary | ICD-10-CM

## 2017-11-11 LAB — GC/CHLAMYDIA PROBE AMP
Chlamydia trachomatis, NAA: NEGATIVE
Neisseria gonorrhoeae by PCR: NEGATIVE

## 2017-11-11 NOTE — Telephone Encounter (Signed)
Patient called stating that Tina Middleton her referred to a gastro Dr for a colonoscopy. Pt would like to go to Dr. Elesa Massed instead of the one [place] picked. Please contact pt

## 2017-11-17 ENCOUNTER — Encounter: Payer: Self-pay | Admitting: Internal Medicine

## 2017-11-17 ENCOUNTER — Telehealth: Payer: Self-pay | Admitting: *Deleted

## 2017-11-17 NOTE — Telephone Encounter (Signed)
LMOVM that GC/CHL was negative.

## 2017-12-15 ENCOUNTER — Ambulatory Visit: Payer: Commercial Managed Care - PPO

## 2018-01-13 ENCOUNTER — Other Ambulatory Visit: Payer: Commercial Managed Care - PPO

## 2018-01-18 ENCOUNTER — Other Ambulatory Visit (INDEPENDENT_AMBULATORY_CARE_PROVIDER_SITE_OTHER): Payer: Self-pay | Admitting: *Deleted

## 2018-01-18 DIAGNOSIS — Z1211 Encounter for screening for malignant neoplasm of colon: Secondary | ICD-10-CM

## 2018-03-01 ENCOUNTER — Ambulatory Visit (INDEPENDENT_AMBULATORY_CARE_PROVIDER_SITE_OTHER): Payer: Commercial Managed Care - PPO

## 2018-03-01 DIAGNOSIS — R1031 Right lower quadrant pain: Secondary | ICD-10-CM | POA: Diagnosis not present

## 2018-03-01 NOTE — Progress Notes (Signed)
PELVIC US TA/TV: heterogeneous uterus w/a fundal right fibroid n/c 1.2 x 1.2 x 1.6 cm,thickened endometrium 9 mm no color flow,normal right ovary,simple left adnexal cyst n/c 1.5 x 1.1 x .9 cm,left ovary not visualized,no free fluid,no pain during ultrasound,right ovary appears mobile

## 2018-03-04 ENCOUNTER — Encounter: Payer: Self-pay | Admitting: Adult Health

## 2018-03-04 ENCOUNTER — Telehealth: Payer: Self-pay | Admitting: Adult Health

## 2018-03-04 DIAGNOSIS — R9389 Abnormal findings on diagnostic imaging of other specified body structures: Secondary | ICD-10-CM | POA: Insufficient documentation

## 2018-03-04 HISTORY — DX: Abnormal findings on diagnostic imaging of other specified body structures: R93.89

## 2018-03-04 NOTE — Telephone Encounter (Signed)
Pt aware that US showed simple cyst on right and small fibroid, with no change, and EEC 9 mm, which is thickened, discussed with Dr Glo Herring and he agrees with endometrial biopsy

## 2018-03-15 ENCOUNTER — Ambulatory Visit (INDEPENDENT_AMBULATORY_CARE_PROVIDER_SITE_OTHER): Payer: Commercial Managed Care - PPO | Admitting: Obstetrics and Gynecology

## 2018-03-15 ENCOUNTER — Encounter: Payer: Self-pay | Admitting: Obstetrics and Gynecology

## 2018-03-15 VITALS — BP 120/77 | HR 63 | Ht 66.0 in | Wt 173.8 lb

## 2018-03-15 DIAGNOSIS — R9389 Abnormal findings on diagnostic imaging of other specified body structures: Secondary | ICD-10-CM

## 2018-03-15 DIAGNOSIS — Z3202 Encounter for pregnancy test, result negative: Secondary | ICD-10-CM | POA: Diagnosis not present

## 2018-03-15 LAB — POCT URINE PREGNANCY: Preg Test, Ur: NEGATIVE

## 2018-03-15 NOTE — Addendum Note (Signed)
Addended by: Linton Rump on: 03/15/2018 12:04 PM   Modules accepted: Orders

## 2018-03-15 NOTE — Progress Notes (Signed)
Patient ID: Tina Middleton, female   DOB: 17-Apr-1966, 52 y.o.   MRN: [id number]  Got u/s and was put on progesterone and did follow up and endometrium was slightly thickened. Came back for another f/u and slightly thicker endometrium so biopsy was scheduled. She had breast cancer and is concerned about this biopsy. She got a lumpectomy, chemo and radiation. Over 20 years ago she had cryo therapy done on her cervix.  Patient given informed consent, signed copy in the chart, time out was performed. Appropriate time out taken. . The patient was placed in the lithotomy position and the cervix brought into view with sterile speculum.  Portio of cervix cleansed x 2 with betadine swabs, 5cc of lidocaine was used.as cervical and paracervical block  A tenaculum was placed in the anterior lip of the cervix.  The uterus was sounded for depth of 6cm. A pipelle was introduced to into the uterus, suction created,  and an endometrial sample was obtained. All equipment was removed and accounted for.  The patient tolerated the procedure well.   Patient given post procedure instructions. will call patient with results in 1 week.  By signing my name below, I, Samul Dada, attest that this documentation has been prepared under the direction and in the presence of Jonnie Kind, MD. Electronically Signed: Chicken. 03/15/18. 9:54 AM.  I personally performed the services described in this documentation, which was SCRIBED in my presence. The recorded information has been reviewed and considered accurate. It has been edited as necessary during review. Jonnie Kind, MD

## 2018-03-19 ENCOUNTER — Telehealth: Payer: Self-pay | Admitting: Obstetrics and Gynecology

## 2018-03-19 NOTE — Telephone Encounter (Signed)
Please call and leave a message for the Endo Bx results on Monday okay to leave message

## 2018-03-24 ENCOUNTER — Telehealth: Payer: Self-pay | Admitting: *Deleted

## 2018-03-26 NOTE — Telephone Encounter (Signed)
Patient informed no hyperplasia, atypia or malignancy identified.

## 2018-04-05 ENCOUNTER — Telehealth: Payer: Self-pay | Admitting: Adult Health

## 2018-04-05 NOTE — Telephone Encounter (Signed)
Patient called stating that she would like a call back from [place], [place] states that she has some question regarding the procedure she had and some symptoms she is having now. Pt states that she has other questions as well. Please contact pt

## 2018-04-05 NOTE — Telephone Encounter (Signed)
Left message I called 

## 2018-04-06 NOTE — Telephone Encounter (Signed)
Pt aware of pathology, has greenish discharge,no odor, has taken diflucan.

## 2018-05-11 ENCOUNTER — Telehealth (INDEPENDENT_AMBULATORY_CARE_PROVIDER_SITE_OTHER): Payer: Self-pay | Admitting: *Deleted

## 2018-05-11 ENCOUNTER — Encounter (INDEPENDENT_AMBULATORY_CARE_PROVIDER_SITE_OTHER): Payer: Self-pay | Admitting: *Deleted

## 2018-05-11 MED ORDER — SUPREP BOWEL PREP KIT 17.5-3.13-1.6 GM/177ML PO SOLN: 1.0000 | Freq: Once | ORAL | 0 refills | Status: AC

## 2018-05-11 NOTE — Telephone Encounter (Signed)
Patient needs suprep 

## 2018-05-25 ENCOUNTER — Telehealth (INDEPENDENT_AMBULATORY_CARE_PROVIDER_SITE_OTHER): Payer: Self-pay | Admitting: *Deleted

## 2018-05-25 NOTE — Telephone Encounter (Signed)
Referring MD: jennifer griffin (gyn) PCP: Rosalita Chessman   Procedure: tcs  Reason/Indication:  screening  Has patient had this procedure before?  Yes, 15 yrs ago  If so, when, by whom and where?    Is there a family history of colon cancer?  no  Who?  What age when diagnosed?    Is patient diabetic?   no      Does patient have prosthetic heart valve or mechanical valve?  nono  Do you have a pacemaker?  yes  Has patient ever had endocarditis? no  Has patient had joint replacement within last 12 months?  no  Is patient constipated or do they take laxatives? no  Does patient have a history of alcohol/drug use?  no  Is patient on blood thinner such as Coumadin, Plavix and/or Aspirin? no  Medications: betapace 120 mg 1/2 tab bid, metoprolol 25 mg bid, k dur 20 meq daily, mag ox 400 mg daily, lipitor 10 mg 2 times a week, levothyroxine 112 mcg daily, fenofibrate 160 mg daily, iron 27 mg daily, calcium 600 mg bid, omeprazole 20 mg daily, alprazolam 0.5 mg prn  Allergies: asa  Medication Adjustment per Dr Lindi Adie, NP: iron 10 days  Procedure date & time: 06/23/18 at 730

## 2018-05-26 NOTE — Telephone Encounter (Signed)
agree

## 2018-06-14 ENCOUNTER — Telehealth (INDEPENDENT_AMBULATORY_CARE_PROVIDER_SITE_OTHER): Payer: Self-pay | Admitting: Internal Medicine

## 2018-06-14 NOTE — Telephone Encounter (Signed)
Patient called stated she went to pick up prep and they told her it was going to be $96 at [facility] in [place] - wants to know if there is anything else or do we have a discount card.  Please call [phone] and leave message.

## 2018-06-15 ENCOUNTER — Other Ambulatory Visit: Payer: Self-pay | Admitting: Adult Health

## 2018-06-15 DIAGNOSIS — Z1231 Encounter for screening mammogram for malignant neoplasm of breast: Secondary | ICD-10-CM

## 2018-06-15 NOTE — Telephone Encounter (Signed)
Spoke to patient, copay card upfront for her, she will pick up this week

## 2018-06-21 ENCOUNTER — Encounter (HOSPITAL_COMMUNITY): Payer: Self-pay

## 2018-06-21 ENCOUNTER — Ambulatory Visit (HOSPITAL_COMMUNITY)
Admission: RE | Admit: 2018-06-21 | Discharge: 2018-06-21 | Disposition: A | Discharge status: Home / Self Care | Payer: Commercial Managed Care - PPO | Source: Ambulatory Visit | Attending: Adult Health | Admitting: Adult Health

## 2018-06-21 DIAGNOSIS — Z1231 Encounter for screening mammogram for malignant neoplasm of breast: Secondary | ICD-10-CM | POA: Diagnosis not present

## 2018-06-21 HISTORY — DX: Personal history of irradiation: Z92.3

## 2018-06-21 HISTORY — DX: Personal history of antineoplastic chemotherapy: Z92.21

## 2018-06-23 ENCOUNTER — Ambulatory Visit (HOSPITAL_COMMUNITY)
Admission: RE | Admit: 2018-06-23 | Discharge: 2018-06-23 | Disposition: A | Discharge status: Home / Self Care | Payer: Commercial Managed Care - PPO | Attending: Internal Medicine | Admitting: Internal Medicine

## 2018-06-23 ENCOUNTER — Encounter (HOSPITAL_COMMUNITY): Payer: Self-pay | Admitting: *Deleted

## 2018-06-23 ENCOUNTER — Encounter (HOSPITAL_COMMUNITY)
Admission: RE | Disposition: A | Discharge status: Home / Self Care | Payer: Self-pay | Source: Home / Self Care | Attending: Internal Medicine

## 2018-06-23 ENCOUNTER — Other Ambulatory Visit: Payer: Self-pay

## 2018-06-23 DIAGNOSIS — F419 Anxiety disorder, unspecified: Secondary | ICD-10-CM | POA: Insufficient documentation

## 2018-06-23 DIAGNOSIS — Z886 Allergy status to analgesic agent status: Secondary | ICD-10-CM | POA: Diagnosis not present

## 2018-06-23 DIAGNOSIS — Z791 Long term (current) use of non-steroidal anti-inflammatories (NSAID): Secondary | ICD-10-CM | POA: Insufficient documentation

## 2018-06-23 DIAGNOSIS — K648 Other hemorrhoids: Secondary | ICD-10-CM | POA: Insufficient documentation

## 2018-06-23 DIAGNOSIS — Z95810 Presence of automatic (implantable) cardiac defibrillator: Secondary | ICD-10-CM | POA: Insufficient documentation

## 2018-06-23 DIAGNOSIS — E039 Hypothyroidism, unspecified: Secondary | ICD-10-CM | POA: Diagnosis not present

## 2018-06-23 DIAGNOSIS — Z79899 Other long term (current) drug therapy: Secondary | ICD-10-CM | POA: Insufficient documentation

## 2018-06-23 DIAGNOSIS — Z853 Personal history of malignant neoplasm of breast: Secondary | ICD-10-CM | POA: Diagnosis not present

## 2018-06-23 DIAGNOSIS — K6289 Other specified diseases of anus and rectum: Secondary | ICD-10-CM | POA: Insufficient documentation

## 2018-06-23 DIAGNOSIS — E785 Hyperlipidemia, unspecified: Secondary | ICD-10-CM | POA: Diagnosis not present

## 2018-06-23 DIAGNOSIS — Z1211 Encounter for screening for malignant neoplasm of colon: Secondary | ICD-10-CM | POA: Insufficient documentation

## 2018-06-23 HISTORY — DX: Hypothyroidism, unspecified: E03.9

## 2018-06-23 HISTORY — DX: Cardiac arrhythmia, unspecified: I49.9

## 2018-06-23 HISTORY — PX: COLONOSCOPY: SHX5424

## 2018-06-23 HISTORY — DX: Presence of automatic (implantable) cardiac defibrillator: Z95.810

## 2018-06-23 HISTORY — DX: Anxiety disorder, unspecified: F41.9

## 2018-06-23 HISTORY — DX: Other complications of anesthesia, initial encounter: T88.59XA

## 2018-06-23 HISTORY — DX: Anemia, unspecified: D64.9

## 2018-06-23 HISTORY — DX: Adverse effect of unspecified anesthetic, initial encounter: T41.45XA

## 2018-06-23 SURGERY — COLONOSCOPY
Anesthesia: Moderate Sedation

## 2018-06-23 MED ORDER — SODIUM CHLORIDE 0.9 % IV SOLN
INTRAVENOUS | Status: DC
  Administered 2018-06-23: 1000 mL via INTRAVENOUS

## 2018-06-23 MED ORDER — STERILE WATER FOR IRRIGATION IR SOLN
Status: DC | PRN
  Administered 2018-06-23: 1.5 mL

## 2018-06-23 MED ORDER — MIDAZOLAM HCL 5 MG/5ML IJ SOLN
INTRAMUSCULAR | Status: AC
  Filled 2018-06-23: qty 5

## 2018-06-23 MED ORDER — MEPERIDINE HCL 50 MG/ML IJ SOLN
INTRAMUSCULAR | Status: AC
  Filled 2018-06-23: qty 1

## 2018-06-23 MED ORDER — MEPERIDINE HCL 50 MG/ML IJ SOLN
INTRAMUSCULAR | Status: DC | PRN
  Administered 2018-06-23 (×3): 25 mg via INTRAVENOUS

## 2018-06-23 MED ORDER — MIDAZOLAM HCL 5 MG/5ML IJ SOLN
INTRAMUSCULAR | Status: DC | PRN
  Administered 2018-06-23 (×6): 2 mg via INTRAVENOUS

## 2018-06-23 MED ORDER — MIDAZOLAM HCL 5 MG/5ML IJ SOLN
INTRAMUSCULAR | Status: AC
  Filled 2018-06-23: qty 10

## 2018-06-23 NOTE — Op Note (Signed)
[facility] Patient Name: Tina Middleton Procedure Date: 06/23/2018 7:28 AM MRN: [id number] Date of Birth: 02-Nov-1965 Attending MD: Hildred Laser , MD CSN: [id number] Age: 52 Admit Type: Outpatient Procedure:                Colonoscopy Indications:              Screening for colorectal malignant neoplasm Providers:                Hildred Laser, MD, Charlsie Quest. Theda Sers RN, RN, Nelma Rothman, Technician Referring MD:             Derrek Monaco, NP Medicines:                Meperidine 75 mg IV, Midazolam 12 mg IV Complications:            No immediate complications. Estimated Blood Loss:     Estimated blood loss: none. Procedure:                Pre-Anesthesia Assessment:                           - Prior to the procedure, a History and Physical                            was performed, and patient medications and                            allergies were reviewed. The patient's tolerance of                            previous anesthesia was also reviewed. The risks                            and benefits of the procedure and the sedation                            options and risks were discussed with the patient.                            All questions were answered, and informed consent                            was obtained. Prior Anticoagulants: The patient                            last took ibuprofen 3 days prior to the procedure.                            ASA Grade Assessment: III - A patient with severe                            systemic disease. After reviewing the risks and  benefits, the patient was deemed in satisfactory                            condition to undergo the procedure.                           After obtaining informed consent, the colonoscope                            was passed under direct vision. Throughout the                            procedure, the patient's blood pressure, pulse, and                   oxygen saturations were monitored continuously. The                            [id number]-[id number] ([id number]) scope was introduced through                            the anus and advanced to the the cecum, identified                            by appendiceal orifice and ileocecal valve. The                            ileocecal valve, appendiceal orifice, and rectum                            were photographed. The colonoscopy was performed                            without difficulty. The patient tolerated the                            procedure well. The quality of the bowel                            preparation was excellent. Scope In: 7:51:15 AM Scope Out: 8:03:28 AM Scope Withdrawal Time: 0 hours 7 minutes 37 seconds  Total Procedure Duration: 0 hours 12 minutes 13 seconds  Findings:      The perianal and digital rectal examinations were normal.      The colon (entire examined portion) appeared normal.      Internal hemorrhoids were found during retroflexion. The hemorrhoids       were small.      Anal papilla(e) were hypertrophied. Impression:               - The entire examined colon is normal.                           - Internal hemorrhoids.                           - single anal papilla.                           -  No specimens collected. Moderate Sedation:      Moderate (conscious) sedation was administered by the endoscopy nurse       and supervised by the endoscopist. The following parameters were       monitored: oxygen saturation, heart rate, blood pressure, CO2       capnography and response to care. Total physician intraservice time was       25 minutes. Recommendation:           - Patient has a contact number available for                            emergencies. The signs and symptoms of potential                            delayed complications were discussed with the                            patient. Return to normal activities tomorrow.                             Written discharge instructions were provided to the                            patient.                           - Resume previous diet today.                           - Continue present medications.                           - Repeat colonoscopy in 10 years for screening                            purposes. Procedure Code(s):        --- Professional ---                           [phone], Colonoscopy, flexible; diagnostic, including                            collection of specimen(s) by brushing or washing,                            when performed (separate procedure)                           99153, Moderate sedation; each additional 15                            minutes intraservice time                           G0500, Moderate sedation services provided by the  same physician or other qualified health care                            professional performing a gastrointestinal                            endoscopic service that sedation supports,                            requiring the presence of an independent trained                            observer to assist in the monitoring of the                            patient's level of consciousness and physiological                            status; initial 15 minutes of intra-service time;                            patient age 83 years or older (additional time may                            be reported with [phone], as appropriate) Diagnosis Code(s):        --- Professional ---                           Z12.11, Encounter for screening for malignant                            neoplasm of colon                           K64.8, Other hemorrhoids                           K62.89, Other specified diseases of anus and rectum CPT copyright 2018 American Medical Association. All rights reserved. The codes documented in this report are preliminary and upon coder review may  be revised to meet current  compliance requirements. Hildred Laser, MD Hildred Laser, MD 06/23/2018 8:13:14 AM This report has been signed electronically. Number of Addenda: 0

## 2018-06-23 NOTE — Discharge Instructions (Signed)
Colonoscopy, Adult, Care After This sheet gives you information about how to care for yourself after your procedure. Your health care provider may also give you more specific instructions. If you have problems or questions, contact your health care provider. What can I expect after the procedure? After the procedure, it is common to have:  A small amount of blood in your stool for 24 hours after the procedure.  Some gas.  Mild abdominal cramping or bloating. Follow these instructions at home: General instructions  For the first 24 hours after the procedure: ? Do not drive or use machinery. ? Do not sign important documents. ? Do not drink alcohol. ? Do your regular daily activities at a slower pace than normal. ? Eat soft, easy-to-digest foods.  Take over-the-counter or prescription medicines only as told by your health care provider. Relieving cramping and bloating   Try walking around when you have cramps or feel bloated.  Apply heat to your abdomen as told by your health care provider. Use a heat source that your health care provider recommends, such as a moist heat pack or a heating pad. ? Place a towel between your skin and the heat source. ? Leave the heat on for 20-30 minutes. ? Remove the heat if your skin turns bright red. This is especially important if you are unable to feel pain, heat, or cold. You may have a greater risk of getting burned. Eating and drinking   Drink enough fluid to keep your urine pale yellow.  Resume your normal diet as instructed by your health care provider. Avoid heavy or fried foods that are hard to digest.  Avoid drinking alcohol for as long as instructed by your health care provider. Contact a health care provider if:  You have blood in your stool 2-3 days after the procedure. Get help right away if:  You have more than a small spotting of blood in your stool.  You pass large blood clots in your stool.  Your abdomen is  swollen.  You have nausea or vomiting.  You have a fever.  You have increasing abdominal pain that is not relieved with medicine. Summary  After the procedure, it is common to have a small amount of blood in your stool. You may also have mild abdominal cramping and bloating.  For the first 24 hours after the procedure, do not drive or use machinery, sign important documents, or drink alcohol.  Contact your health care provider if you have a lot of blood in your stool, nausea or vomiting, a fever, or increased abdominal pain. This information is not intended to replace advice given to you by your health care provider. Make sure you discuss any questions you have with your health care provider. Document Released: 02/05/2004 Document Revised: 04/15/2017 Document Reviewed: 09/04/2015 Elsevier Interactive Patient Education  2019 Elsevier Inc.  Hemorrhoids Hemorrhoids are swollen veins that may develop:  In the butt (rectum). These are called internal hemorrhoids.  Around the opening of the butt (anus). These are called external hemorrhoids. Hemorrhoids can cause pain, itching, or bleeding. Most of the time, they do not cause serious problems. They usually get better with diet changes, lifestyle changes, and other home treatments. What are the causes? This condition may be caused by:  Having trouble pooping (constipation).  Pushing hard (straining) to poop.  Watery poop (diarrhea).  Pregnancy.  Being very overweight (obese).  Sitting for long periods of time.  Heavy lifting or other activity that causes you to  strain.  Anal sex.  Riding a bike for a long period of time. What are the signs or symptoms? Symptoms of this condition include:  Pain.  Itching or soreness in the butt.  Bleeding from the butt.  Leaking poop.  Swelling in the area.  One or more lumps around the opening of your butt. How is this diagnosed? A doctor can often diagnose this condition by  looking at the affected area. The doctor may also:  Do an exam that involves feeling the area with a gloved hand (digital rectal exam).  Examine the area inside your butt using a small tube (anoscope).  Order blood tests. This may be done if you have lost a lot of blood.  Have you get a test that involves looking inside the colon using a flexible tube with a camera on the end (sigmoidoscopy or colonoscopy). How is this treated? This condition can usually be treated at home. Your doctor may tell you to change what you eat, make lifestyle changes, or try home treatments. If these do not help, procedures can be done to remove the hemorrhoids or make them smaller. These may involve:  Placing rubber bands at the base of the hemorrhoids to cut off their blood supply.  Injecting medicine into the hemorrhoids to shrink them.  Shining a type of light energy onto the hemorrhoids to cause them to fall off.  Doing surgery to remove the hemorrhoids or cut off their blood supply. Follow these instructions at home: Eating and drinking   Eat foods that have a lot of fiber in them. These include whole grains, beans, nuts, fruits, and vegetables.  Ask your doctor about taking products that have added fiber (fibersupplements).  Reduce the amount of fat in your diet. You can do this by: ? Eating low-fat dairy products. ? Eating less red meat. ? Avoiding processed foods.  Drink enough fluid to keep your pee (urine) pale yellow. Managing pain and swelling   Take a warm-water bath (sitz bath) for 20 minutes to ease pain. Do this 3-4 times a day. You may do this in a bathtub or using a portable sitz bath that fits over the toilet.  If told, put ice on the painful area. It may be helpful to use ice between your warm baths. ? Put ice in a plastic bag. ? Place a towel between your skin and the bag. ? Leave the ice on for 20 minutes, 2-3 times a day. General instructions  Take over-the-counter and  prescription medicines only as told by your doctor. ? Medicated creams and medicines may be used as told.  Exercise often. Ask your doctor how much and what kind of exercise is best for you.  Go to the bathroom when you have the urge to poop. Do not wait.  Avoid pushing too hard when you poop.  Keep your butt dry and clean. Use wet toilet paper or moist towelettes after pooping.  Do not sit on the toilet for a long time.  Keep all follow-up visits as told by your doctor. This is important. Contact a doctor if you:  Have pain and swelling that do not get better with treatment or medicine.  Have trouble pooping.  Cannot poop.  Have pain or swelling outside the area of the hemorrhoids. Get help right away if you have:  Bleeding that will not stop. Summary  Hemorrhoids are swollen veins in the butt or around the opening of the butt.  They can cause pain, itching,  or bleeding.  Eat foods that have a lot of fiber in them. These include whole grains, beans, nuts, fruits, and vegetables.  Take a warm-water bath (sitz bath) for 20 minutes to ease pain. Do this 3-4 times a day. This information is not intended to replace advice given to you by your health care provider. Make sure you discuss any questions you have with your health care provider. Document Released: 04/01/2008 Document Revised: 11/12/2017 Document Reviewed: 11/12/2017 Elsevier Interactive Patient Education  2019 [place] usual medications and diet as before. No driving for 24 hours. Next screening exam in 10 years.

## 2018-06-23 NOTE — H&P (Signed)
Tina Middleton is an 52 y.o. female.   Chief Complaint: Patient is here for colonoscopy. HPI: Patient is 52 year old Caucasian female who is here for screening colonoscopy.  She denies abdominal pain change in bowel habits or rectal bleeding.  This is patient's first exam. Personal history significant for breast carcinoma and she remains in remission. Family history is negative for CRC.  Past Medical History:  Diagnosis Date  . AICD (automatic cardioverter/defibrillator) present   . Anemia   . Anxiety   . Breast cancer ([place])   .       .    . Complication of anesthesia    PONV      . History of breast cancer in female 10/29/2015  . Hot flashes 10/29/2015  . Hyperlipidemia   . Hypothyroidism   . Irregular periods 10/29/2015  . Peri-menopausal 10/29/2015  . Personal history of chemotherapy 1999  . Personal history of radiation therapy 1999  . Recurrent cold sores   . Thickened endometrium 03/04/2018   Needs endometrial biopsy, EEC was 9 mm  . Thyroid disease   . V tach ([place])    has a defib    Past Surgical History:  Procedure Laterality Date  . BREAST BIOPSY Right 1999   Cancer  . BREAST LUMPECTOMY Right 1999  . CARDIAC DEFIBRILLATOR PLACEMENT    . CESAREAN SECTION    . THYROIDECTOMY    . TONSILLECTOMY    . TUBAL LIGATION      Family History  Problem Relation Age of Onset  . Heart attack Mother   . Cancer Father        lung,brain  . ADD / ADHD Daughter   . Heart attack Maternal Grandmother   . Alzheimer's disease Paternal Grandmother    Social History:  reports that she has never smoked. She has never used smokeless tobacco. She reports current alcohol use of about 1.0 standard drinks of alcohol per week. She reports that she does not use drugs.  Allergies:  Allergies  Allergen Reactions  . Aspirin Other (See Comments)    Hives, itching    Medications Prior to Admission  Medication Sig Dispense Refill  . acyclovir (ZOVIRAX) 400 MG tablet Take 1 tablet (400 mg  total) by mouth 2 (two) times daily. (Patient taking differently: Take 400 mg by mouth daily as needed (fever blisters). ) 20 tablet 1  . ALPRAZolam (XANAX) 0.5 MG tablet Take 0.25 mg by mouth 2 (two) times daily as needed for anxiety.   0  . atorvastatin (LIPITOR) 10 MG tablet Take 10 mg by mouth See admin instructions. Takes 1 tab on Mondays and Thursdays    . fenofibrate 160 MG tablet Take 160 mg by mouth at bedtime.  2  . ferrous sulfate 325 (65 FE) MG tablet Take 325 mg by mouth daily with breakfast.    . fexofenadine (ALLEGRA) 180 MG tablet Take 180 mg by mouth daily as needed for allergies or rhinitis.    Marland Kitchen FIBER SELECT GUMMIES PO Take 2 each by mouth daily.    Marland Kitchen ibuprofen (ADVIL,MOTRIN) 200 MG tablet Take 400-800 mg by mouth every 8 (eight) hours as needed (pain).     Marland Kitchen levothyroxine (SYNTHROID, LEVOTHROID) 112 MCG tablet Take 112 mcg by mouth daily before breakfast.   2  . magnesium oxide (MAG-OX) 400 MG tablet Take 400 mg by mouth daily.     . metoprolol tartrate (LOPRESSOR) 25 MG tablet Take 25 mg by mouth 2 (two) times daily.  2  .  Omega-3 1000 MG CAPS Take 2 capsules by mouth at bedtime. Takes 2 qhs    . potassium chloride SA (K-DUR,KLOR-CON) 20 MEQ tablet Take 20 mEq by mouth daily.  2  . sotalol (BETAPACE) 120 MG tablet Take 180 mg by mouth 2 (two) times daily. 1.5 tabs twice daily  2  . CALCIUM PO Take 2 tablets by mouth at bedtime. Takes 2 at bedtime       No results found for this or any previous visit (from the past 48 hour(s)). Mm 3d Screen Breast Bilateral  Result Date: 06/22/2018 CLINICAL DATA:  Screening. EXAM: DIGITAL SCREENING BILATERAL MAMMOGRAM WITH TOMO AND CAD COMPARISON:  Previous exam(s). ACR Breast Density Category c: The breast tissue is heterogeneously dense, which may obscure small masses. FINDINGS: There are no findings suspicious for malignancy. Images were processed with CAD. IMPRESSION: No mammographic evidence of malignancy. A result letter of this  screening mammogram will be mailed directly to the patient. RECOMMENDATION: Screening mammogram in one year. (Code:SM-B-01Y) BI-RADS CATEGORY  1: Negative. Electronically Signed   By: Curlene Dolphin M.D.   On: 06/22/2018 12:48    ROS  Blood pressure 117/78, pulse 68, temperature 98.2 F (36.8 C), temperature source Oral, resp. rate 18, height 5\' 6"  (1.676 m), weight 77.1 kg, SpO2 100 %. Physical Exam  Constitutional: She appears well-developed and well-nourished.  HENT:  Mouth/Throat: Oropharynx is clear and moist.  Eyes: Conjunctivae are normal.  Neck: No thyromegaly present.  Cardiovascular: Normal rate, regular rhythm and normal heart sounds.  No murmur heard. Respiratory: Effort normal and breath sounds normal.  Patient has pacemaker/AICD in left pectoral region.  GI:  Abdomen is symmetrical with Pfannenstiel scar.  Abdomen is soft and nontender with organomegaly or masses.  Musculoskeletal:        General: No edema.  Lymphadenopathy:    She has no cervical adenopathy.  Neurological: She is alert.  Skin: Skin is warm and dry.     Assessment/Plan Average or screening colonoscopy.  Hildred Laser, MD 06/23/2018, 7:33 AM

## 2018-06-29 ENCOUNTER — Encounter (HOSPITAL_COMMUNITY): Payer: Self-pay | Admitting: Internal Medicine

## 2018-12-15 IMAGING — MG 2D DIGITAL SCREENING BILATERAL MAMMOGRAM WITH CAD AND ADJUNCT TO
6 of 10 series · 6 of 26 positions shown · non-contrast
Comparison: Previous exam(s).

CLINICAL DATA: Screening.

EXAM:
2D DIGITAL SCREENING BILATERAL MAMMOGRAM WITH CAD AND ADJUNCT TOMO

[R MLO (1 of 2)]
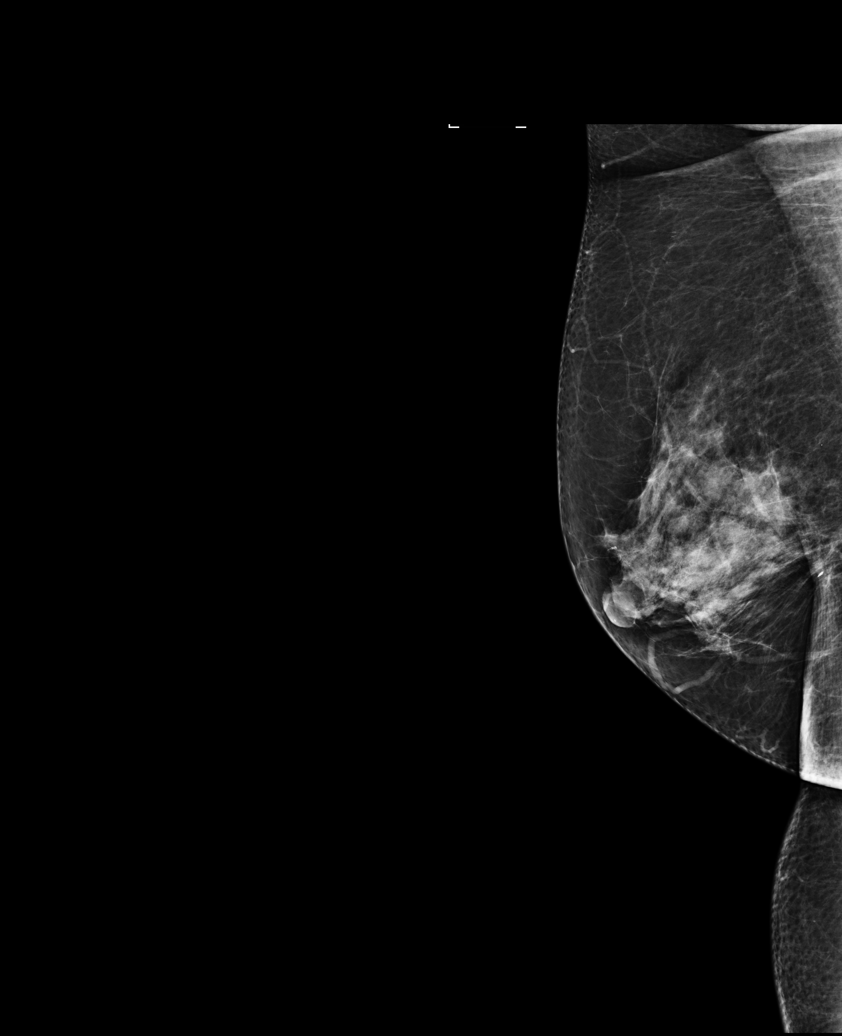

[L MLO (1 of 2)]
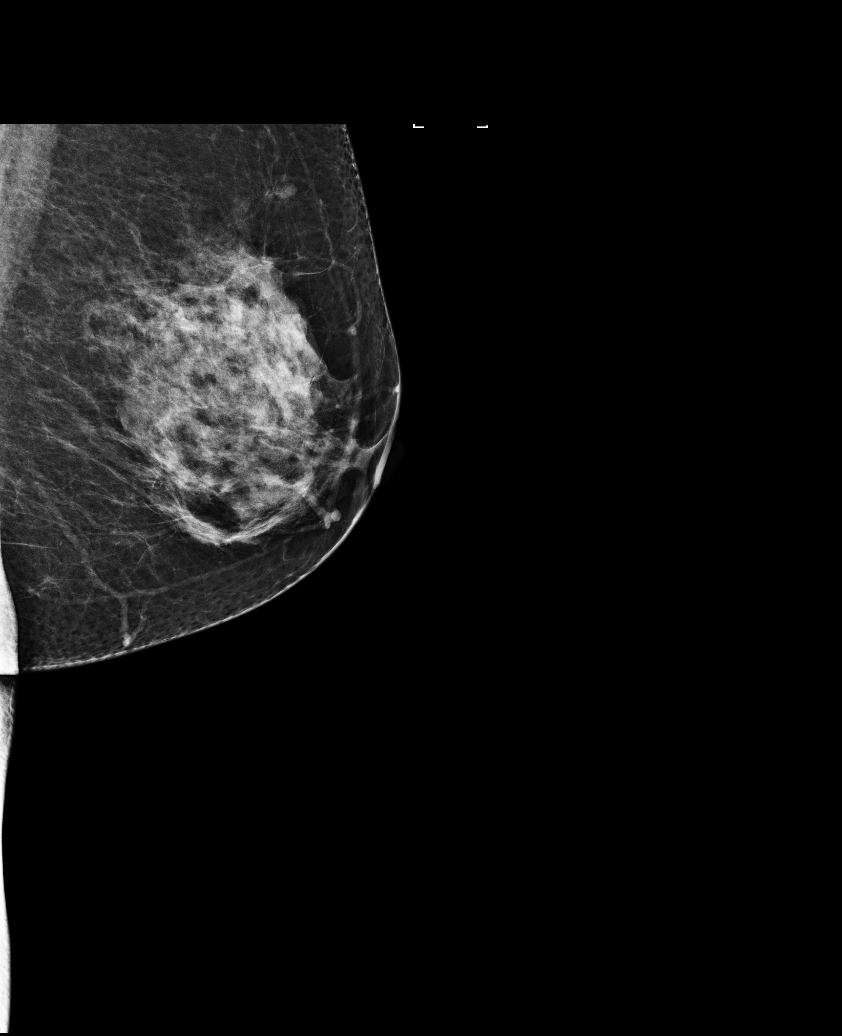

[R MLO (2 of 2)]
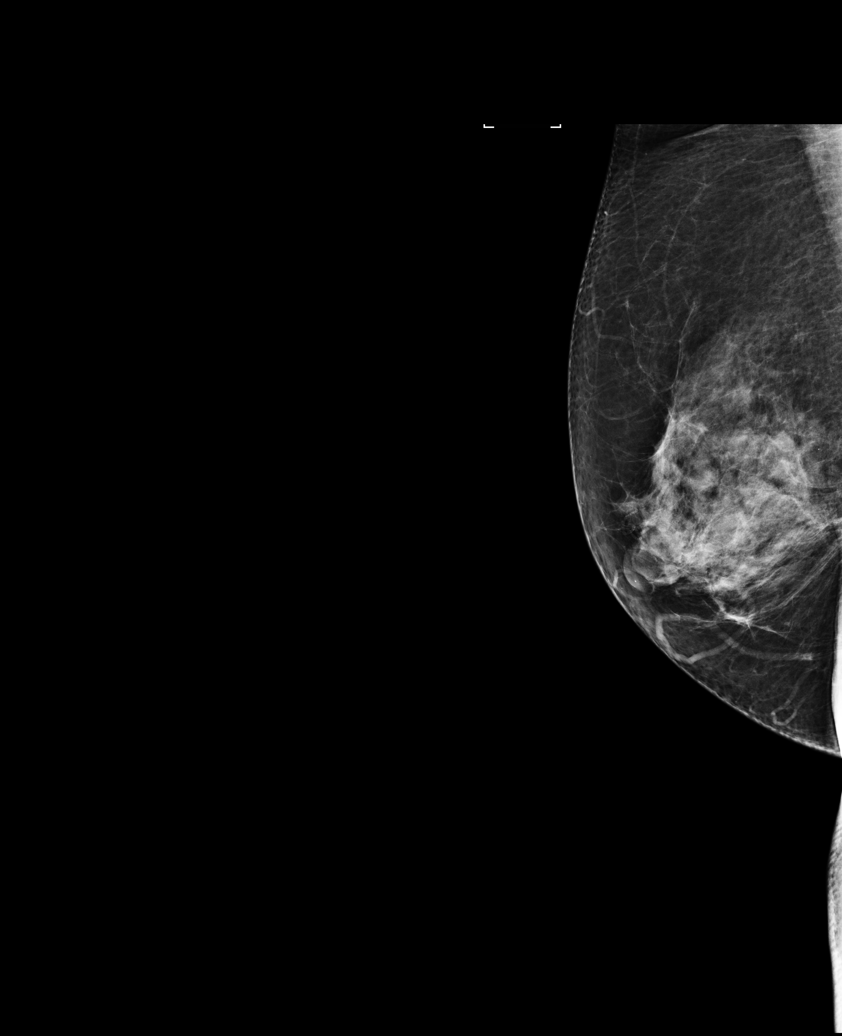

[R CC]
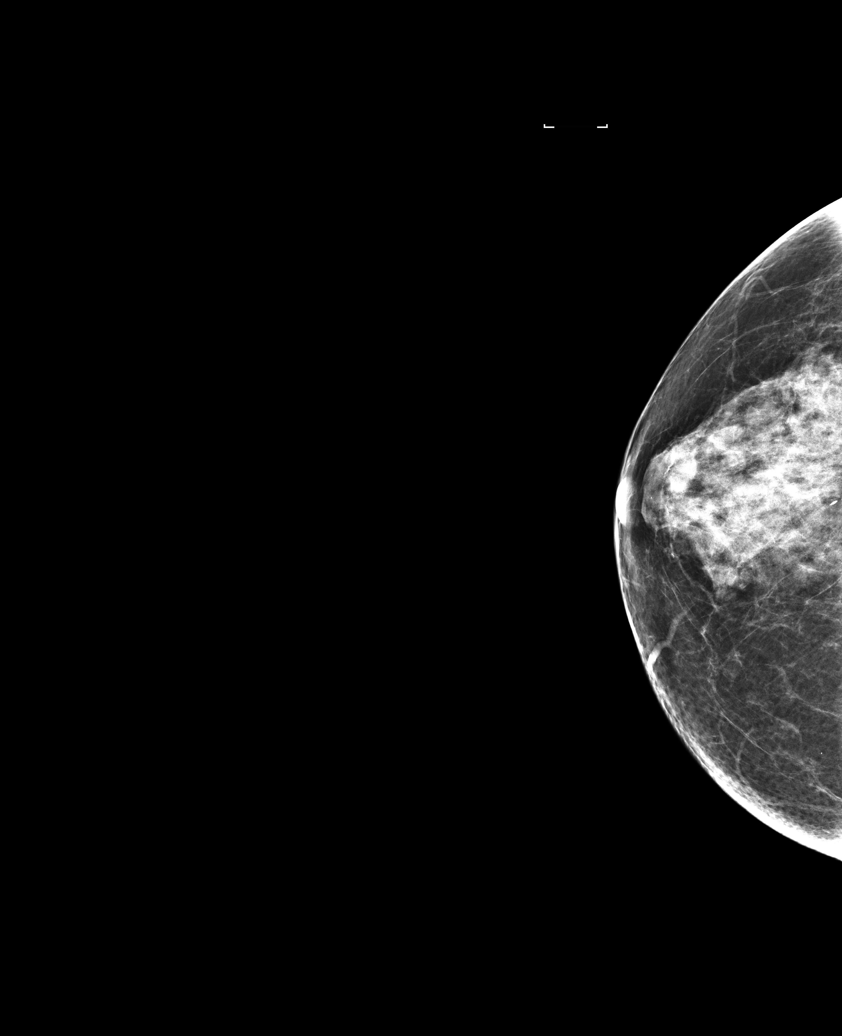

[L MLO (2 of 2)]
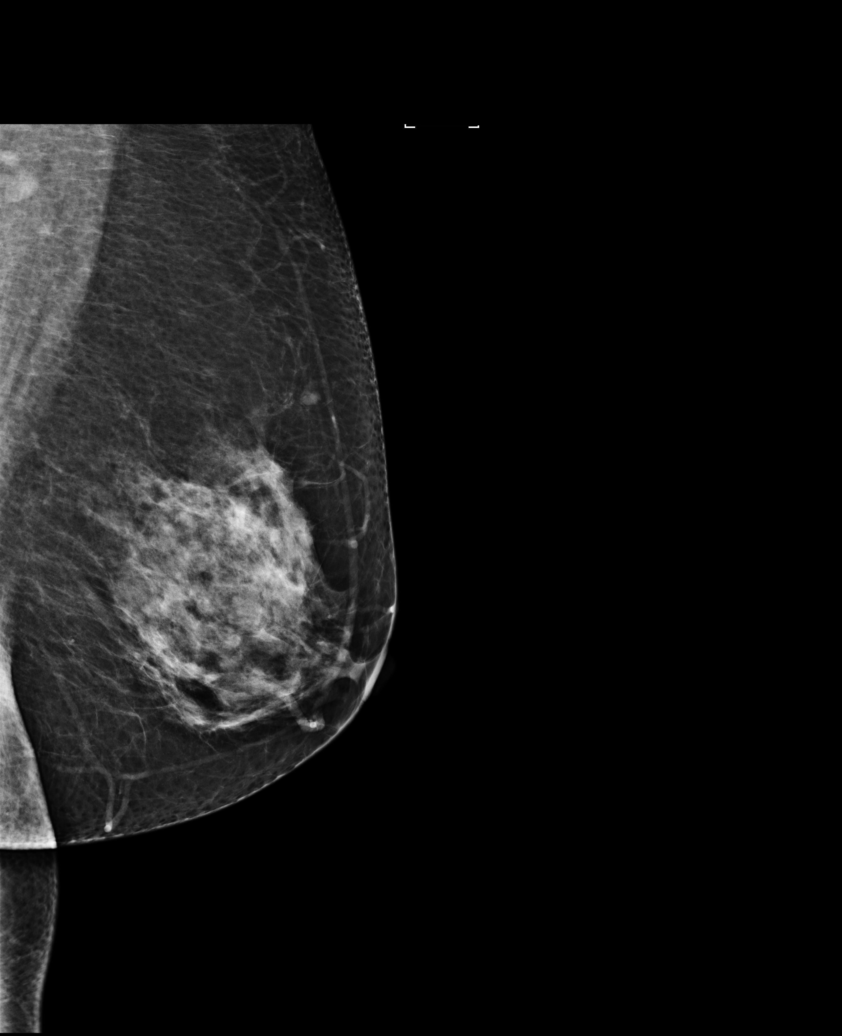

[L CC]
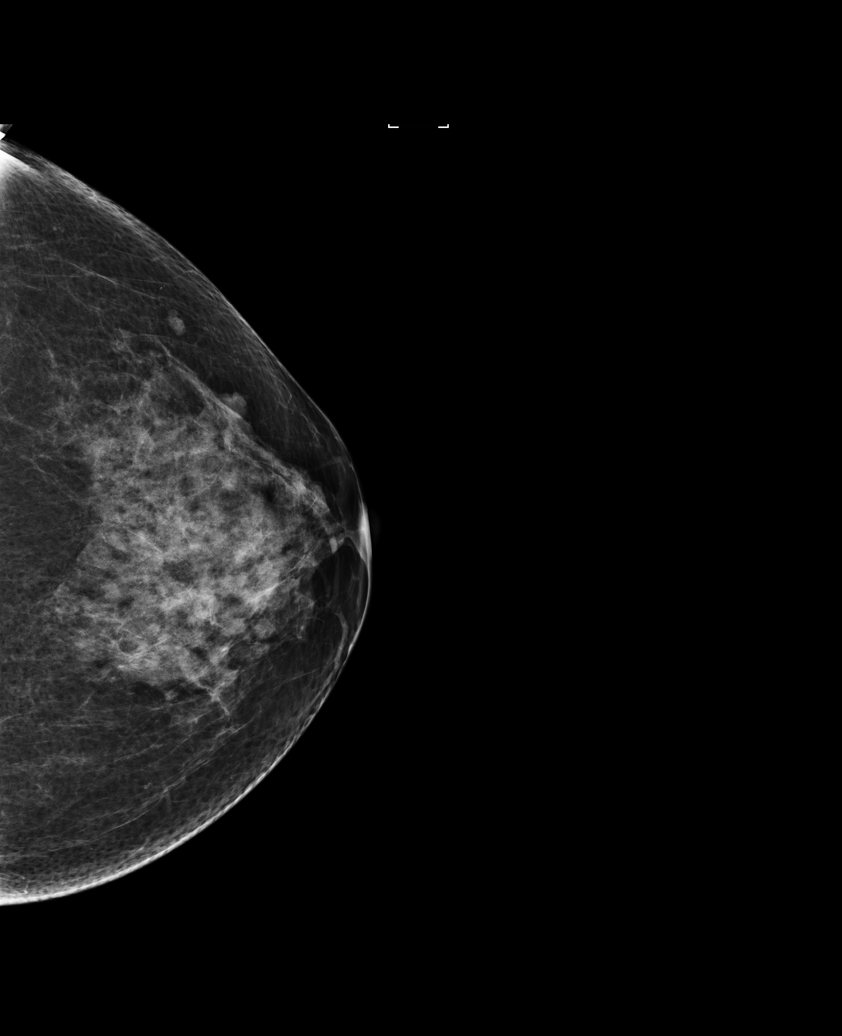

[6 of 26 positions shown; findings below may reference images not displayed]

ACR Breast Density Category c: The breast tissue is heterogeneously
dense, which may obscure small masses.
FINDINGS: There are no findings suspicious for malignancy. Images were
processed with CAD.
IMPRESSION: No mammographic evidence of malignancy. A result letter of this
screening mammogram will be mailed directly to the patient.

RECOMMENDATION:
Screening mammogram in one year. (Code:TN-0-K4T)

BI-RADS CATEGORY  1: Negative.

## 2019-11-02 ENCOUNTER — Other Ambulatory Visit: Payer: Self-pay

## 2019-11-02 ENCOUNTER — Ambulatory Visit (HOSPITAL_COMMUNITY)
Admission: RE | Admit: 2019-11-02 | Discharge: 2019-11-02 | Disposition: A | Discharge status: Home / Self Care | Payer: Commercial Managed Care - PPO | Source: Ambulatory Visit | Attending: Nurse Practitioner | Admitting: Nurse Practitioner

## 2019-11-02 ENCOUNTER — Other Ambulatory Visit (HOSPITAL_COMMUNITY): Payer: Self-pay | Admitting: Nurse Practitioner

## 2019-11-02 DIAGNOSIS — M25512 Pain in left shoulder: Secondary | ICD-10-CM | POA: Insufficient documentation

## 2019-11-02 DIAGNOSIS — W19XXXA Unspecified fall, initial encounter: Secondary | ICD-10-CM | POA: Insufficient documentation

## 2019-11-02 DIAGNOSIS — Z95810 Presence of automatic (implantable) cardiac defibrillator: Secondary | ICD-10-CM | POA: Insufficient documentation

## 2020-01-03 ENCOUNTER — Other Ambulatory Visit: Payer: Self-pay

## 2020-01-03 ENCOUNTER — Encounter: Payer: Self-pay | Admitting: Emergency Medicine

## 2020-01-03 ENCOUNTER — Ambulatory Visit
Admission: EM | Admit: 2020-01-03 | Discharge: 2020-01-03 | Disposition: A | Discharge status: Home / Self Care | Payer: Commercial Managed Care - PPO | Attending: Emergency Medicine | Admitting: Emergency Medicine

## 2020-01-03 DIAGNOSIS — E039 Hypothyroidism, unspecified: Secondary | ICD-10-CM | POA: Insufficient documentation

## 2020-01-03 DIAGNOSIS — Z79899 Other long term (current) drug therapy: Secondary | ICD-10-CM | POA: Insufficient documentation

## 2020-01-03 DIAGNOSIS — Z113 Encounter for screening for infections with a predominantly sexual mode of transmission: Secondary | ICD-10-CM

## 2020-01-03 DIAGNOSIS — Z202 Contact with and (suspected) exposure to infections with a predominantly sexual mode of transmission: Secondary | ICD-10-CM | POA: Diagnosis present

## 2020-01-03 DIAGNOSIS — N898 Other specified noninflammatory disorders of vagina: Secondary | ICD-10-CM | POA: Diagnosis not present

## 2020-01-03 DIAGNOSIS — Z79890 Hormone replacement therapy (postmenopausal): Secondary | ICD-10-CM | POA: Insufficient documentation

## 2020-01-03 DIAGNOSIS — Z853 Personal history of malignant neoplasm of breast: Secondary | ICD-10-CM | POA: Diagnosis not present

## 2020-01-03 MED ORDER — METRONIDAZOLE 500 MG PO TABS: 500.0000 mg | ORAL_TABLET | Freq: Two times a day (BID) | ORAL | 0 refills | Status: DC

## 2020-01-03 NOTE — Discharge Instructions (Signed)
Vaginal self swab  obtained  Prescribed metronidazole 500 mg twice daily for 7 days (do not take while consuming alcohol) Take medications as prescribed and to completion We will follow up with you regarding the results of your test If tests are positive, please abstain from sexual activity until you and your partner(s) are treated Follow up with PCP or [facility] if symptoms persists Return here or go to ER if you have any new or worsening symptoms

## 2020-01-03 NOTE — ED Provider Notes (Addendum)
[facility]   [id number] 01/03/20 Arrival Time: 1194   CC: STD check  SUBJECTIVE:  Tina Middleton is a 54 y.o. female who presents requesting for STD screening.  Currently symptomatic with thin/white vaginal discharge and some odor.  Report partner cheated with someone else.  Last unprotected sexual encounter within few days.  Sexually active with 1 female  partner.  Reports similar symptoms in the past, diagnosed with BV. Denies fever, chills, nausea, vomiting, abdominal or pelvic pain, vaginal rashes or lesions.      No LMP recorded. (Menstrual status: Perimenopausal).  ROS: As per HPI.  All other pertinent ROS negative.     Past Medical History:  Diagnosis Date  . AICD (automatic cardioverter/defibrillator) present   . Anemia   . Anxiety   . Breast cancer ([place])   . Breast disorder    breast cancer  . Cancer ([place])   . Complication of anesthesia    PONV  . Dysrhythmia   . History of breast cancer in female 10/29/2015  . Hot flashes 10/29/2015  . Hyperlipidemia   . Hypothyroidism   . Irregular periods 10/29/2015  . Peri-menopausal 10/29/2015  . Personal history of chemotherapy 1999  . Personal history of radiation therapy 1999  . Recurrent cold sores   . Thickened endometrium 03/04/2018   Needs endometrial biopsy, EEC was 9 mm  . Thyroid disease   . V tach ([place])    has a defib   Past Surgical History:  Procedure Laterality Date  . BREAST BIOPSY Right 1999   Cancer  . BREAST LUMPECTOMY Right 1999  . CARDIAC DEFIBRILLATOR PLACEMENT    . CESAREAN SECTION    . COLONOSCOPY N/A 06/23/2018   Procedure: COLONOSCOPY;  Surgeon: Rogene Houston, MD;  Location: [facility];  Service: Endoscopy;  Laterality: N/A;  730  . THYROIDECTOMY    . TONSILLECTOMY    . TUBAL LIGATION     Allergies  Allergen Reactions  . Aspirin Other (See Comments)    Hives, itching   No current facility-administered medications on file prior to encounter.   Current Outpatient  Medications on File Prior to Encounter  Medication Sig Dispense Refill  . acyclovir (ZOVIRAX) 400 MG tablet Take 1 tablet (400 mg total) by mouth 2 (two) times daily. (Patient taking differently: Take 400 mg by mouth daily as needed (fever blisters). ) 20 tablet 1  . ALPRAZolam (XANAX) 0.5 MG tablet Take 0.25 mg by mouth 2 (two) times daily as needed for anxiety.   0  . atorvastatin (LIPITOR) 10 MG tablet Take 10 mg by mouth See admin instructions. Takes 1 tab on Mondays and Thursdays    . CALCIUM PO Take 2 tablets by mouth at bedtime. Takes 2 at bedtime     . fenofibrate 160 MG tablet Take 160 mg by mouth at bedtime.  2  . ferrous sulfate 325 (65 FE) MG tablet Take 325 mg by mouth daily with breakfast.    . fexofenadine (ALLEGRA) 180 MG tablet Take 180 mg by mouth daily as needed for allergies or rhinitis.    Marland Kitchen FIBER SELECT GUMMIES PO Take 2 each by mouth daily.    Marland Kitchen ibuprofen (ADVIL,MOTRIN) 200 MG tablet Take 400-800 mg by mouth every 8 (eight) hours as needed (pain).     Marland Kitchen levothyroxine (SYNTHROID, LEVOTHROID) 112 MCG tablet Take 112 mcg by mouth daily before breakfast.   2  . magnesium oxide (MAG-OX) 400 MG tablet Take 400 mg by mouth daily.     Marland Kitchen  metoprolol tartrate (LOPRESSOR) 25 MG tablet Take 25 mg by mouth 2 (two) times daily.  2  . potassium chloride SA (K-DUR,KLOR-CON) 20 MEQ tablet Take 20 mEq by mouth daily.  2  . sotalol (BETAPACE) 120 MG tablet Take 180 mg by mouth 2 (two) times daily. 1.5 tabs twice daily  2  . Omega-3 1000 MG CAPS Take 2 capsules by mouth at bedtime. Takes 2 qhs     Social History   Socioeconomic History  . Marital status: Divorced    Spouse name: Not on file  . Number of children: Not on file  . Years of education: Not on file  . Highest education level: Not on file  Occupational History  . Not on file  Tobacco Use  . Smoking status: Never Smoker  . Smokeless tobacco: Never Used  Vaping Use  . Vaping Use: Never used  Substance and Sexual Activity    . Alcohol use: Yes    Alcohol/week: 1.0 standard drink    Types: 1 Glasses of wine per week    Comment: occasional  . Drug use: No  . Sexual activity: Not Currently    Partners: Male    Birth control/protection: Surgical    Comment: tubal  Other Topics Concern  . Not on file  Social History Narrative  . Not on file   Social Determinants of Health   Financial Resource Strain:   . Difficulty of Paying Living Expenses:   Food Insecurity:   . Worried About Charity fundraiser in the Last Year:   . Arboriculturist in the Last Year:   Transportation Needs:   . Film/video editor (Medical):   Marland Kitchen Lack of Transportation (Non-Medical):   Physical Activity:   . Days of Exercise per Week:   . Minutes of Exercise per Session:   Stress:   . Feeling of Stress :   Social Connections:   . Frequency of Communication with Friends and Family:   . Frequency of Social Gatherings with Friends and Family:   . Attends Religious Services:   . Active Member of Clubs or Organizations:   . Attends Archivist Meetings:   Marland Kitchen Marital Status:   Intimate Partner Violence:   . Fear of Current or Ex-Partner:   . Emotionally Abused:   Marland Kitchen Physically Abused:   . Sexually Abused:    Family History  Problem Relation Age of Onset  . Heart attack Mother   . Cancer Father        lung,brain  . ADD / ADHD Daughter   . Heart attack Maternal Grandmother   . Alzheimer's disease Paternal Grandmother     OBJECTIVE:  Vitals:   01/03/20 1730 01/03/20 1731  BP: 103/67   Pulse: 68   Resp: 18   Temp: 98.1 F (36.7 C)   TempSrc: Oral   SpO2: 98%   Weight:  184 lb (83.5 kg)  Height:  5\' 6"  (1.676 m)     General appearance: alert, NAD, appears stated age Head: NCAT Throat: lips, mucosa, and tongue normal; teeth and gums normal Lungs: CTA bilaterally without adventitious breath sounds Heart: regular rate and rhythm.  Radial pulses 2+ symmetrical bilaterally Back: no CVA tenderness Abdomen:  soft, non-tender; bowel sounds normal; no masses or organomegaly; no guarding or rebound tenderness GU: deferred Skin: warm and dry Psychological:  Alert and cooperative. Normal mood and affect.  LABS:  Results for orders placed or performed in visit on 03/15/18  POCT  urine pregnancy  Result Value Ref Range   Preg Test, Ur Negative Negative    Labs Reviewed  CERVICOVAGINAL ANCILLARY ONLY    ASSESSMENT & PLAN:  1. Screening for STD (sexually transmitted disease)     Meds ordered this encounter  Medications  . metroNIDAZOLE (FLAGYL) 500 MG tablet    Sig: Take 1 tablet (500 mg total) by mouth 2 (two) times daily.    Dispense:  14 tablet    Refill:  0    Pending: Labs Reviewed  CERVICOVAGINAL ANCILLARY ONLY   Patient is stable at discharge.  She will be treated for bacterial vaginosis.  Will await cervical ancillary test result for reevaluation.  Discharge instructions  Vaginal self swab  obtained  Prescribed metronidazole 500 mg twice daily for 7 days (do not take while consuming alcohol) Take medications as prescribed and to completion We will follow up with you regarding the results of your test If tests are positive, please abstain from sexual activity until you and your partner(s) are treated Follow up with PCP or [facility] if symptoms persists Return here or go to ER if you have any new or worsening symptoms    Reviewed expectations re: course of current medical issues. Questions answered. Outlined signs and symptoms indicating need for more acute intervention. Patient verbalized understanding. After Visit Summary given.       Emerson Monte, FNP 01/03/20 1750    Emerson Monte, FNP 01/03/20 1754

## 2020-01-03 NOTE — ED Triage Notes (Signed)
Pt would like std check, having vaginal discharge and some odor.  States her boyfriend had sex with someone else and she would like to be screened.

## 2020-01-05 LAB — CERVICOVAGINAL ANCILLARY ONLY
Bacterial Vaginitis (gardnerella): POSITIVE — AB
Candida Glabrata: NEGATIVE
Candida Vaginitis: NEGATIVE
Chlamydia: NEGATIVE
Comment: NEGATIVE
Comment: NEGATIVE
Comment: NEGATIVE
Comment: NEGATIVE
Comment: NEGATIVE
Comment: NORMAL
Neisseria Gonorrhea: NEGATIVE
Trichomonas: NEGATIVE

## 2020-01-19 ENCOUNTER — Ambulatory Visit (INDEPENDENT_AMBULATORY_CARE_PROVIDER_SITE_OTHER): Payer: Commercial Managed Care - PPO | Admitting: Gastroenterology

## 2020-07-16 ENCOUNTER — Ambulatory Visit (INDEPENDENT_AMBULATORY_CARE_PROVIDER_SITE_OTHER): Payer: Commercial Managed Care - PPO | Admitting: Adult Health

## 2020-07-16 ENCOUNTER — Encounter: Payer: Self-pay | Admitting: Adult Health

## 2020-07-16 ENCOUNTER — Other Ambulatory Visit: Payer: Self-pay

## 2020-07-16 ENCOUNTER — Other Ambulatory Visit (HOSPITAL_COMMUNITY)
Admission: RE | Admit: 2020-07-16 | Discharge: 2020-07-16 | Disposition: A | Discharge status: Home / Self Care | Payer: Commercial Managed Care - PPO | Source: Ambulatory Visit | Attending: Adult Health | Admitting: Adult Health

## 2020-07-16 VITALS — BP 103/70 | HR 65 | Ht 66.0 in | Wt 190.8 lb

## 2020-07-16 DIAGNOSIS — Z01419 Encounter for gynecological examination (general) (routine) without abnormal findings: Secondary | ICD-10-CM | POA: Insufficient documentation

## 2020-07-16 DIAGNOSIS — Z853 Personal history of malignant neoplasm of breast: Secondary | ICD-10-CM

## 2020-07-16 DIAGNOSIS — Z1211 Encounter for screening for malignant neoplasm of colon: Secondary | ICD-10-CM | POA: Diagnosis not present

## 2020-07-16 DIAGNOSIS — G479 Sleep disorder, unspecified: Secondary | ICD-10-CM

## 2020-07-16 LAB — HEMOCCULT GUIAC POC 1CARD (OFFICE): Fecal Occult Blood, POC: NEGATIVE

## 2020-07-16 MED ORDER — HYDROXYZINE HCL 10 MG PO TABS: 10.0000 mg | ORAL_TABLET | Freq: Three times a day (TID) | ORAL | 0 refills | Status: DC | PRN

## 2020-07-16 NOTE — Progress Notes (Signed)
Patient ID: Tina Middleton, female   DOB: May 26, 1966, 55 y.o.   MRN: [id number] History of Present Illness: Tina Middleton is a 55 year old white female,divorced, PM in for a well woman gyn exam and pap. She still works at [employer]. She was seen earlier at [facility] for new genetic testing, she had breast cancer. She has had 3 COVID vaccines, will send me the dates to up date her chart.  She weaned off xanax and Ambien.  No current PCP, he cardiologist is Dr. Rosalita Chessman.   Current Medications, Allergies, Past Medical History, Past Surgical History, Family History and Social History were reviewed in [employer] record.     Review of Systems:  Patient denies any headaches, hearing loss, fatigue, blurred vision, shortness of breath, chest pain, abdominal pain, problems with bowel movements, urination, or intercourse(not active). No joint pain or mood swings. She denies any vaginal bleeding. Does not sleep well, lays awake for hours has tired melatonin and benadryl   Physical Exam:BP 103/70 (BP Location: Left Arm, Patient Position: Sitting, Cuff Size: Normal)   Pulse 65   Ht 5\' 6"  (1.676 m)   Wt 190 lb 12.8 oz (86.5 kg)   LMP 10/02/2015   BMI 30.80 kg/m  General:  Well developed, well nourished, no acute distress Skin:  Warm and dry Neck:  Midline trachea, normal thyroid, good ROM, no lymphadenopathy Lungs; Clear to auscultation bilaterally Breast:  No dominant palpable mass, retraction, or nipple discharge, right breast is smaller, and has defibrillator  about left breast Cardiovascular: Regular rate and rhythm Abdomen:  Soft, non tender, no hepatosplenomegaly Pelvic:  External genitalia is normal in appearance, no lesions.  The vagina is pale with loss of moisture and rugae. Urethra has no lesions or masses. The cervix is smooth, pap with HRHPV genotyping performed.  Uterus is felt to be normal size, shape, and contour.  No adnexal masses or tenderness noted.Bladder  is non tender, no masses felt. Rectal: Good sphincter tone, no polyps, or hemorrhoids felt.  Hemoccult negative. Extremities/musculoskeletal:  No swelling or varicosities noted, no clubbing or cyanosis Psych:  No mood changes, alert and cooperative,seems happy AA is 2 Fall risk is low PHQ 9 score is 2 and GAD 7 score is 0  Upstream - 07/16/20 1444      Pregnancy Intention Screening   Does the patient want to become pregnant in the next year? No    Does the patient's partner want to become pregnant in the next year? No    Would the patient like to discuss contraceptive options today? No      Contraception Wrap Up   Current Method No Method - Other Reason   post-menopausal   End Method No Method - Other Reason   PM   Contraception Counseling Provided No         Examination chaperoned by Glenard Haring RN  Impression and Plan: 1. Encounter for gynecological examination with Papanicolaou smear of cervix Pap sent Physical in 1 year Pap in 3 if normal Mammogram yearly Labs with cardiologist Colonoscopy per GI  2. History of breast cancer in female  3. Sleep disturbance Will try vistaril Meds ordered this encounter  Medications  . hydrOXYzine (ATARAX/VISTARIL) 10 MG tablet    Sig: Take 1 tablet (10 mg total) by mouth 3 (three) times daily as needed.    Dispense:  30 tablet    Refill:  0    Order Specific Question:   Supervising Provider  Answer:   EURE, LUTHER H [[id number]]    4. Encounter for screening fecal occult blood testing

## 2020-07-20 LAB — CYTOLOGY - PAP
Comment: NEGATIVE
Diagnosis: NEGATIVE
High risk HPV: NEGATIVE

## 2020-09-25 ENCOUNTER — Telehealth: Payer: Self-pay | Admitting: Obstetrics & Gynecology

## 2020-09-25 NOTE — Telephone Encounter (Signed)
ERROR

## 2020-10-04 ENCOUNTER — Other Ambulatory Visit: Payer: Self-pay | Admitting: Adult Health

## 2021-01-03 HISTORY — PX: GALLBLADDER SURGERY: SHX652

## 2021-04-12 LAB — HM MAMMOGRAPHY

## 2021-05-07 HISTORY — PX: CARDIAC DEFIBRILLATOR REMOVAL: SHX1291

## 2021-05-15 ENCOUNTER — Other Ambulatory Visit: Payer: Self-pay | Admitting: Adult Health

## 2021-08-09 ENCOUNTER — Other Ambulatory Visit: Payer: Commercial Managed Care - PPO | Admitting: Adult Health

## 2021-08-26 ENCOUNTER — Other Ambulatory Visit: Payer: Self-pay

## 2021-08-26 ENCOUNTER — Encounter: Payer: Self-pay | Admitting: Adult Health

## 2021-08-26 ENCOUNTER — Ambulatory Visit (INDEPENDENT_AMBULATORY_CARE_PROVIDER_SITE_OTHER): Payer: Commercial Managed Care - PPO | Admitting: Adult Health

## 2021-08-26 VITALS — BP 82/59 | HR 68 | Ht 65.5 in | Wt 171.0 lb

## 2021-08-26 DIAGNOSIS — Z1211 Encounter for screening for malignant neoplasm of colon: Secondary | ICD-10-CM | POA: Diagnosis not present

## 2021-08-26 DIAGNOSIS — Z853 Personal history of malignant neoplasm of breast: Secondary | ICD-10-CM

## 2021-08-26 DIAGNOSIS — Z01419 Encounter for gynecological examination (general) (routine) without abnormal findings: Secondary | ICD-10-CM | POA: Diagnosis not present

## 2021-08-26 LAB — HEMOCCULT GUIAC POC 1CARD (OFFICE): Fecal Occult Blood, POC: NEGATIVE

## 2021-08-26 MED ORDER — HYDROXYZINE HCL 10 MG PO TABS: 10.0000 mg | ORAL_TABLET | Freq: Three times a day (TID) | ORAL | 6 refills | Status: DC | PRN

## 2021-08-26 NOTE — Progress Notes (Signed)
Patient ID: Tina Middleton, female   DOB: 1965/08/24, 56 y.o.   MRN: [id number] History of Present Illness: Tina Middleton is a 56 year old white female, divorced, PM in for well woman gyn exam. She had breast cancer on the right and had chemo. She is doing well, has lost 20 lbs in last 6 months, and looks great. She is still working at [employer]. She saw dermatology this morning and has cardiology appt this afternoon.Has cardiac defibrillator.  PCP is Dr Rosalita Chessman Lab Results  Component Value Date   DIAGPAP  07/16/2020    - Negative for intraepithelial lesion or malignancy (NILM)   Bertha Negative 07/16/2020      Current Medications, Allergies, Past Medical History, Past Surgical History, Family History and Social History were reviewed in [employer] record.     Review of Systems: Patient denies any headaches, hearing loss, fatigue, blurred vision, shortness of breath, chest pain, abdominal pain, problems with bowel movements, urination, or intercourse(not active). No joint pain or mood swings.  Vistaril helps with sleep as does lavender tea. Denies any vaginal bleeding   Physical Exam:BP (!) 82/59 (BP Location: Left Arm, Patient Position: Sitting, Cuff Size: Normal)    Pulse 68    Ht 5' 5.5" (1.664 m)    Wt 171 lb (77.6 kg)    LMP 10/02/2015    BMI 28.02 kg/m   General:  Well developed, well nourished, no acute distress Skin:  Warm and dry Neck:  Midline trachea, normal thyroid, good ROM, no lymphadenopathy Lungs; Clear to auscultation bilaterally Breast:  No dominant palpable mass, retraction, or nipple discharge, right smaller than left  Cardiovascular: Regular rate and rhythm Abdomen:  Soft, non tender, no hepatosplenomegaly Pelvic:  External genitalia is normal in appearance, no lesions.  The vagina is pale with loss of rugae. Urethra has no lesions or masses. The cervix is smooth. Uterus is felt to be normal size, shape, and contour.  No adnexal masses or tenderness  noted.Bladder is non tender, no masses felt. Rectal: Good sphincter tone, no polyps, or hemorrhoids felt.  Hemoccult negative. Extremities/musculoskeletal:  No swelling or varicosities noted, no clubbing or cyanosis Psych:  No mood changes, alert and cooperative,seems happy AA is 1 Fall risk is low Depression screen [facility] 2/9 08/26/2021 07/16/2020 11/09/2017  Decreased Interest 0 0 0  Down, Depressed, Hopeless 0 0 0  PHQ - 2 Score 0 0 0  Altered sleeping 0 2 -  Tired, decreased energy 0 0 -  Change in appetite 0 0 -  Feeling bad or failure about yourself  0 0 -  Trouble concentrating 0 0 -  Moving slowly or fidgety/restless 0 0 -  Suicidal thoughts 0 0 -  PHQ-9 Score 0 2 -    GAD 7 : Generalized Anxiety Score 08/26/2021 07/16/2020  Nervous, Anxious, on Edge 0 0  Control/stop worrying 1 0  Worry too much - different things 1 0  Trouble relaxing 1 0  Restless 0 0  Easily annoyed or irritable 0 0  Afraid - awful might happen 1 0  Total GAD 7 Score 4 0  Has vistaril prn  Upstream - 08/26/21 1148       Pregnancy Intention Screening   Does the patient want to become pregnant in the next year? N/A    Does the patient's partner want to become pregnant in the next year? N/A    Would the patient like to discuss contraceptive options today? N/A  Contraception Wrap Up   Current Method Female Sterilization   postmenopausal   End Method Female Sterilization   postmenopausal   Contraception Counseling Provided No            Examination chaperoned by Levy Pupa LPN    Impression and plan: 1. Encounter for well woman exam with routine gynecological exam Physical in 1 year Pap in 2025 Colonoscopy per GI Labs with PCP Meds ordered this encounter  Medications   hydrOXYzine (ATARAX) 10 MG tablet    Sig: Take 1 tablet (10 mg total) by mouth 3 (three) times daily as needed.    Dispense:  30 tablet    Refill:  6    Order Specific Question:   Supervising Provider    Answer:   Elonda Husky,  LUTHER H [[id number]]     2. Encounter for screening fecal occult blood testing  3. History of breast cancer in female Mammogram yearly

## 2022-03-07 ENCOUNTER — Other Ambulatory Visit: Payer: Self-pay

## 2022-03-07 ENCOUNTER — Telehealth: Payer: Self-pay

## 2022-03-07 ENCOUNTER — Encounter: Payer: Self-pay | Admitting: Internal Medicine

## 2022-03-07 ENCOUNTER — Ambulatory Visit (INDEPENDENT_AMBULATORY_CARE_PROVIDER_SITE_OTHER): Payer: Commercial Managed Care - PPO | Admitting: Internal Medicine

## 2022-03-07 VITALS — BP 112/70 | HR 97 | Ht 66.0 in | Wt 176.8 lb

## 2022-03-07 DIAGNOSIS — F411 Generalized anxiety disorder: Secondary | ICD-10-CM | POA: Diagnosis not present

## 2022-03-07 DIAGNOSIS — E611 Iron deficiency: Secondary | ICD-10-CM | POA: Diagnosis not present

## 2022-03-07 DIAGNOSIS — E039 Hypothyroidism, unspecified: Secondary | ICD-10-CM

## 2022-03-07 DIAGNOSIS — I4729 Other ventricular tachycardia: Secondary | ICD-10-CM | POA: Diagnosis not present

## 2022-03-07 DIAGNOSIS — E7800 Pure hypercholesterolemia, unspecified: Secondary | ICD-10-CM

## 2022-03-07 DIAGNOSIS — Z853 Personal history of malignant neoplasm of breast: Secondary | ICD-10-CM

## 2022-03-07 DIAGNOSIS — Z0000 Encounter for general adult medical examination without abnormal findings: Secondary | ICD-10-CM

## 2022-03-07 MED ORDER — SERTRALINE HCL 25 MG PO TABS: 50.0000 mg | ORAL_TABLET | Freq: Every day | ORAL | 1 refills | Status: DC

## 2022-03-07 MED ORDER — SERTRALINE HCL 25 MG PO TABS: ORAL_TABLET | ORAL | 1 refills | Status: DC

## 2022-03-07 MED ORDER — SERTRALINE HCL 50 MG PO TABS
ORAL_TABLET | ORAL | 1 refills | Status: DC
Start: 2022-03-07 — End: 2022-07-04

## 2022-03-07 NOTE — Progress Notes (Unsigned)
New Patient Office Visit  Subjective    Patient ID: Tina Middleton, female    DOB: Nov 09, 1965  Age: 56 y.o. MRN: [id number]  CC:  Chief Complaint  Patient presents with   Establish Care    HPI Tina Middleton presents to establish care. She is a 56 year-old woman with a past medical history significant for breast cancer, paroxysmal VT s/p ICD, iron deficiency, thyroid nodules s/p thyroidectomy, and hypercholesterolemia. She has not had a PCP in many years but has been most closely followed by her cardiologist, Dr. Rosalita Chessman, in [place], [place].  Today Tina Middleton presents to establish care. As noted above, she has been most recently followed by several specialists but has not had a PCP in many years. Her acute concern today is feeling 'overwhelmed' due to multiple life stressors. Tina Middleton is currently employed at the [employer] and is close to retirement. She states that she feels as though her supervisor has singled her out and is monitoring everything that she does, looking for mistakes as an excuse to get Tina Middleton into trouble. This has caused her to feel constantly stressed and anxious at work and  is affecting her sleep at night. She also describes recent stress at home related to family members needing to move in with her temporarily. She has also been concerned about her health lately, as she recently had a different ICD placed. Tina Middleton became tearful when discussing her stress and anxiety level, stating that she does not feel like herself due to the constant worry. This is the main reason she believed now was the appropriate time to establish care. She otherwise denies acute concerns or symptoms currently.   Outpatient Encounter Medications as of 03/07/2022  Medication Sig   acyclovir (ZOVIRAX) 400 MG tablet Take 1 tablet (400 mg total) by mouth 2 (two) times daily. (Patient taking differently: Take 400 mg by mouth daily as needed (fever blisters).)   atorvastatin  (LIPITOR) 10 MG tablet Take 10 mg by mouth See admin instructions. Takes 1 tab on Mondays and Thursdays   CALCIUM PO Take 2 tablets by mouth at bedtime. Takes 2 at bedtime   fenofibrate 160 MG tablet Take 160 mg by mouth at bedtime.   ferrous sulfate 325 (65 FE) MG tablet Take 325 mg by mouth daily with breakfast.   FIBER SELECT GUMMIES PO Take 2 each by mouth.   hydrOXYzine (ATARAX) 10 MG tablet Take 1 tablet (10 mg total) by mouth 3 (three) times daily as needed.   levothyroxine (SYNTHROID, LEVOTHROID) 112 MCG tablet Take 112 mcg by mouth daily before breakfast.    magnesium oxide (MAG-OX) 400 MG tablet Take 400 mg by mouth daily.    metoprolol tartrate (LOPRESSOR) 25 MG tablet Take 25 mg by mouth 2 (two) times daily.   potassium chloride SA (K-DUR,KLOR-CON) 20 MEQ tablet Take 20 mEq by mouth daily.   sotalol (BETAPACE) 120 MG tablet Take 180 mg by mouth 2 (two) times daily. 1.5 tabs twice daily   [DISCONTINUED] ibuprofen (ADVIL,MOTRIN) 200 MG tablet Take 400-800 mg by mouth every 8 (eight) hours as needed (pain).    [DISCONTINUED] sertraline (ZOLOFT) 25 MG tablet Take 2 tablets (50 mg total) by mouth daily. Take one tablet (25 mg) by mouth daily for the first 7 days, then 50 mg after   No facility-administered encounter medications on file as of 03/07/2022.    Past Medical History:  Diagnosis Date   AICD (automatic cardioverter/defibrillator) present  Anemia    Anxiety    Breast cancer ([place])    Breast disorder    breast cancer   Cancer ([place])    Complication of anesthesia    PONV   Dysrhythmia    History of breast cancer in female 10/29/2015   Hot flashes 10/29/2015   Hyperlipidemia    Hypothyroidism    Irregular periods 10/29/2015   Peri-menopausal 10/29/2015   Personal history of chemotherapy 1999   Personal history of radiation therapy 1999   Recurrent cold sores    Thickened endometrium 03/04/2018   Needs endometrial biopsy, EEC was 9 mm   Thyroid disease    V tach ([place])     has a defib    Past Surgical History:  Procedure Laterality Date   BREAST BIOPSY Right 1999   Cancer   BREAST LUMPECTOMY Right [place]  05/07/2021   and replaced   CESAREAN SECTION     COLONOSCOPY N/A 06/23/2018   Procedure: COLONOSCOPY;  Surgeon: Rogene Houston, MD;  [facility];  Service: Endoscopy;  Laterality: N/A;  730   GALLBLADDER SURGERY  01/03/2021   THYROIDECTOMY     TONSILLECTOMY     TUBAL LIGATION      Family History  Problem Relation Age of Onset   Heart attack Mother    Cancer Father        lung,brain   ADD / ADHD Daughter    Heart attack Maternal Grandmother    Alzheimer's disease Paternal Grandmother     Social History   Socioeconomic History   Marital status: Divorced    Spouse name: Not on file   Number of children: Not on file   Years of education: Not on file   Highest education level: Not on file  Occupational History   Not on file  Tobacco Use   Smoking status: Never   Smokeless tobacco: Never  Vaping Use   Vaping Use: Never used  Substance and Sexual Activity   Alcohol use: Yes    Alcohol/week: 1.0 standard drink of alcohol    Types: 1 Glasses of wine per week    Comment: occasional   Drug use: No   Sexual activity: Not Currently    Partners: Male    Birth control/protection: Surgical, Post-menopausal    Comment: tubal  Other Topics Concern   Not on file  Social History Narrative   Not on file   Social Determinants of Health   Financial Resource Strain: Low Risk  (08/26/2021)   Overall Financial Resource Strain (CARDIA)    Difficulty of Paying Living Expenses: Not hard at all  Food Insecurity: No Food Insecurity (08/26/2021)   Hunger Vital Sign    Worried About Running Out of Food in the Last Year: Never true    Ran Out of Food in the Last Year: Never true  Transportation Needs: No Transportation Needs (08/26/2021)   PRAPARE - Civil engineer, contracting (Medical): No    Lack of Transportation (Non-Medical): No  Physical Activity: Insufficiently Active (08/26/2021)   Exercise Vital Sign    Days of Exercise per Week: 3 days    Minutes of Exercise per Session: 30 min  Stress: Stress Concern Present (08/26/2021)   [place]    Feeling of Stress : To some extent  Social Connections: Moderately Isolated (08/26/2021)   Social Connection and Isolation  Panel [NHANES]    Frequency of Communication with Friends and Family: More than three times a week    Frequency of Social Gatherings with Friends and Family: Twice a week    Attends Religious Services: More than 4 times per year    Active Member of [employer] or Organizations: No    Attends Archivist Meetings: Never    Marital Status: Divorced  Human resources officer Violence: Not At Risk (08/26/2021)   Humiliation, Afraid, Rape, and Kick questionnaire    Fear of Current or Ex-Partner: No    Emotionally Abused: No    Physically Abused: No    Sexually Abused: No   Review of Systems  Constitutional:  Negative for chills and fever.  HENT:  Negative for sore throat.   Respiratory:  Negative for cough and shortness of breath.   Cardiovascular:  Negative for chest pain, palpitations and leg swelling.  Gastrointestinal:  Negative for abdominal pain, blood in stool, constipation, diarrhea, nausea and vomiting.  Genitourinary:  Negative for dysuria and hematuria.  Musculoskeletal:  Negative for myalgias.  Skin:  Negative for itching and rash.  Neurological:  Negative for dizziness and headaches.  Psychiatric/Behavioral:  Negative for depression and suicidal ideas. The patient is nervous/anxious.        Significant stress   Objective    BP 112/70   Pulse 97   Ht '5\' 6"'$  (1.676 m)   Wt 176 lb 12.8 oz (80.2 kg)   LMP 10/02/2015   SpO2 97%   BMI 28.54 kg/m   Physical Exam Vitals reviewed.  Constitutional:       General: She is not in acute distress.    Appearance: Normal appearance. She is not ill-appearing.  HENT:     Head: Normocephalic and atraumatic.     Right Ear: External ear normal.     Left Ear: External ear normal.     Nose: Nose normal. No congestion or rhinorrhea.     Mouth/Throat:     Mouth: Mucous membranes are moist.     Pharynx: Oropharynx is clear. No oropharyngeal exudate or posterior oropharyngeal erythema.  Eyes:     General: No scleral icterus.    Extraocular Movements: Extraocular movements intact.     Conjunctiva/sclera: Conjunctivae normal.     Pupils: Pupils are equal, round, and reactive to light.  Neck:     Comments: Surgical scar from thyroidectomy Cardiovascular:     Rate and Rhythm: Normal rate and regular rhythm.     Pulses: Normal pulses.     Heart sounds: Normal heart sounds.  Pulmonary:     Effort: Pulmonary effort is normal.     Breath sounds: Normal breath sounds. No wheezing, rhonchi or rales.  Abdominal:     General: Abdomen is flat. Bowel sounds are normal. There is no distension.     Palpations: Abdomen is soft. There is no mass.     Tenderness: There is no abdominal tenderness.  Musculoskeletal:        General: Normal range of motion.     Cervical back: Normal range of motion.     Right lower leg: No edema.     Left lower leg: No edema.  Lymphadenopathy:     Cervical: No cervical adenopathy.  Skin:    General: Skin is warm and dry.     Capillary Refill: Capillary refill takes less than 2 seconds.     Coloration: Skin is not jaundiced.  Neurological:     General: No focal deficit  present.     Mental Status: She is alert and oriented to person, place, and time.  Psychiatric:     Comments: Tearful, anxious    Assessment & Plan:   Problem List Items Addressed This Visit       Cardiovascular and Mediastinum   Paroxysmal VT ([place])    S/p AICD. Followed by cardiology, Dr. Rosalita Chessman, in , [place]. She is also taking both metoprolol and  sotalol . Asymptomatic today.        Endocrine   Hypothyroidism (acquired)    S/p thyroidectomy due to multinodular thyroid. Currently prescribed Synthroid, no recent adjustments. Asymptomatic today. -Follow up latest TSH/T4 at next appointment.        Other   History of breast cancer in female    Dx 1999. S/p lumpectomy. States that her mammogram is UTD. Followed at [facility].      GAD (generalized anxiety disorder)    She expresses significant anxiety today, largely related to work, but also due to stressors at home and with her health. This is affecting her sleep as well. She becomes tearful when describing her anxiety and states that she would like to to try a non-habit forming medication that will allow her to feel more like herself. -Given anxiety in multiple aspects of her life, affecting sleep, and overall quality of life, we will start Zoloft today. -Plan for follow up in 4 weeks for reassessment / medication titration      Iron deficiency    Currently on iron supplementation. Pt states that she has had labs recently checked at an outside provider.  -Review latest labs at follow up in 4 weeks and update iron studies if > 1 year      Hypercholesterolemia    Reported history. Currently taking atorvastatin 10 mg daily.  -She will bring her latest labwork to follow up in 4 weeks. Can update lipid panel, titrate medications if needed.      Preventative health care    Presenting today to establish care. She will bring her latest labwork to her next follow up appointment. -States that she will receive influenza, COVID-19 booster, and Shingrix vaccines at her job -Check HIV/HCV at next appointment -Cancer screenings UTD      Other Visit Diagnoses     Generalized anxiety disorder    -  Primary       Return in about 4 weeks (around 04/04/2022).   Johnette Abraham, MD

## 2022-03-07 NOTE — Telephone Encounter (Signed)
Patient called  Will not pay for meds by the way script is written:  sertraline (ZOLOFT) 25 MG tablet    Sig: Take 2 tablets (50 mg total) by mouth daily. Take one tablet (25 mg) by mouth daily for the first 7 days, then 50 mg after  [employer]

## 2022-03-07 NOTE — Patient Instructions (Signed)
It was a pleasure to see you today.  Thank you for giving [place] the opportunity to be involved in your care.  Below is a brief recap of your visit and next steps.  We will plan to see you again in 4 weeks.  Summary We are starting Zoloft today. Take 25 mg for the first week and then 50 mg after.  Next steps Plan for follow up in 4 weeks. Please try to get lab results from your other providers in the interim if possible.

## 2022-03-12 DIAGNOSIS — F411 Generalized anxiety disorder: Secondary | ICD-10-CM | POA: Insufficient documentation

## 2022-03-12 DIAGNOSIS — I4729 Other ventricular tachycardia: Secondary | ICD-10-CM | POA: Insufficient documentation

## 2022-03-12 DIAGNOSIS — Z0000 Encounter for general adult medical examination without abnormal findings: Secondary | ICD-10-CM | POA: Insufficient documentation

## 2022-03-12 DIAGNOSIS — E039 Hypothyroidism, unspecified: Secondary | ICD-10-CM | POA: Insufficient documentation

## 2022-03-12 DIAGNOSIS — E7800 Pure hypercholesterolemia, unspecified: Secondary | ICD-10-CM | POA: Insufficient documentation

## 2022-03-12 DIAGNOSIS — E611 Iron deficiency: Secondary | ICD-10-CM | POA: Insufficient documentation

## 2022-03-12 NOTE — Assessment & Plan Note (Signed)
Reported history. Currently taking atorvastatin 10 mg daily.  -She will bring her latest labwork to follow up in 4 weeks. Can update lipid panel, titrate medications if needed.

## 2022-03-12 NOTE — Assessment & Plan Note (Signed)
S/p AICD. Followed by cardiology, Dr. Rosalita Chessman, in [place], [place]. She is also taking both metoprolol and sotalol . Asymptomatic today.

## 2022-03-12 NOTE — Assessment & Plan Note (Signed)
Currently on iron supplementation. Pt states that she has had labs recently checked at an outside provider.  -Review latest labs at follow up in 4 weeks and update iron studies if > 1 year

## 2022-03-12 NOTE — Assessment & Plan Note (Signed)
S/p thyroidectomy due to multinodular thyroid. Currently prescribed Synthroid, no recent adjustments. Asymptomatic today. -Follow up latest TSH/T4 at next appointment.

## 2022-03-12 NOTE — Assessment & Plan Note (Signed)
Presenting today to establish care. She will bring her latest labwork to her next follow up appointment. -States that she will receive influenza, COVID-19 booster, and Shingrix vaccines at her job -Check HIV/HCV at next appointment -Cancer screenings UTD

## 2022-03-12 NOTE — Assessment & Plan Note (Signed)
She expresses significant anxiety today, largely related to work, but also due to stressors at home and with her health. This is affecting her sleep as well. She becomes tearful when describing her anxiety and states that she would like to to try a non-habit forming medication that will allow her to feel more like herself. -Given anxiety in multiple aspects of her life, affecting sleep, and overall quality of life, we will start Zoloft today. -Plan for follow up in 4 weeks for reassessment / medication titration

## 2022-03-12 NOTE — Assessment & Plan Note (Signed)
Dx 1999. S/p lumpectomy. States that her mammogram is UTD. Followed at [facility].

## 2022-04-04 ENCOUNTER — Encounter: Payer: Self-pay | Admitting: Internal Medicine

## 2022-04-04 ENCOUNTER — Ambulatory Visit (INDEPENDENT_AMBULATORY_CARE_PROVIDER_SITE_OTHER): Payer: Commercial Managed Care - PPO | Admitting: Internal Medicine

## 2022-04-04 VITALS — BP 110/66 | HR 58 | Ht 66.0 in | Wt 177.0 lb

## 2022-04-04 DIAGNOSIS — F411 Generalized anxiety disorder: Secondary | ICD-10-CM

## 2022-04-04 DIAGNOSIS — Z0001 Encounter for general adult medical examination with abnormal findings: Secondary | ICD-10-CM

## 2022-04-04 DIAGNOSIS — Z0000 Encounter for general adult medical examination without abnormal findings: Secondary | ICD-10-CM | POA: Diagnosis not present

## 2022-04-04 NOTE — Patient Instructions (Signed)
It was a pleasure to see you today.  Thank you for giving [place] the opportunity to be involved in your care.  Below is a brief recap of your visit and next steps.  We will plan to see you again in 3 months.  Summary No changes to medications today. I am glad things are going well.  Next steps Follow up in 3 months I will review your recent labs

## 2022-04-04 NOTE — Assessment & Plan Note (Signed)
Seen today at follow-up. -She states that she will receive influenza and shingles vaccines at work next month.  She is also waiting for the new COVID-19 booster vaccine to be shipped to her work, and she will then receive the vaccine. -She is scheduled to undergo repeat mammogram on 10/27. -Reports that she recently had labs completed by her cardiologist in [place], [place].  We will review these records and plan to update labs at her next appointment.

## 2022-04-04 NOTE — Progress Notes (Signed)
Established Patient Office Visit  Subjective   Patient ID: Tina Middleton, female    DOB: 03/13/1966  Age: 56 y.o. MRN: [id number]  Chief Complaint  Patient presents with   Follow-up   Tina Middleton returns to care today.  She is a 56 year old woman with a past medical history significant for breast cancer s/p lumpectomy (1999), paroxysmal VT s/p ICD, iron deficiency, thyroid nodules s/p thyroidectomy, and hypercholesterolemia.  Last seen by me on 03/07/2022 to establish care.  At that time Tina Middleton described feeling overwhelmed due to multiple life stressors.  She endorsed difficulty sleeping and feeling constantly stressed and anxious at work.  She was started on Zoloft 50 mg daily.  She returns today for reassessment.  Today Tina Middleton states that her anxiety is improving.  She does not feel overwhelmed and is able to handle stressful situations much more gracefully.  She is pleased with her current medication regimen and does not wish to make any changes today.  Tina Middleton is otherwise asymptomatic and denies acute concerns.  Chronic medical conditions and outstanding preventative healthcare maintenance items discussed today are individually addressed in A/P below.  Past Medical History:  Diagnosis Date   AICD (automatic cardioverter/defibrillator) present    Anemia    Anxiety    Breast cancer ([place])    Breast disorder    breast cancer   Cancer ([place])    Complication of anesthesia    PONV   Dysrhythmia    History of breast cancer in female 10/29/2015   Hot flashes 10/29/2015   Hyperlipidemia    Hypothyroidism    Irregular periods 10/29/2015   Peri-menopausal 10/29/2015   Personal history of chemotherapy 1999   Personal history of radiation therapy 1999   Recurrent cold sores    Thickened endometrium 03/04/2018   Needs endometrial biopsy, EEC was 9 mm   Thyroid disease    V tach ([place])    has a defib   Past Surgical History:  Procedure Laterality Date   BREAST BIOPSY Right 1999    Cancer   BREAST LUMPECTOMY Right [place]  05/07/2021   and replaced   CESAREAN SECTION     COLONOSCOPY N/A 06/23/2018   Procedure: COLONOSCOPY;  Surgeon: Rogene Houston, MD;  [facility];  Service: Endoscopy;  Laterality: N/A;  [place]  01/03/2021   THYROIDECTOMY     TONSILLECTOMY     TUBAL LIGATION     Social History   Tobacco Use   Smoking status: Never   Smokeless tobacco: Never  Vaping Use   Vaping Use: Never used  Substance Use Topics   Alcohol use: Yes    Alcohol/week: 1.0 standard drink of alcohol    Types: 1 Glasses of wine per week    Comment: occasional   Drug use: No   Family History  Problem Relation Age of Onset   Heart attack Mother    Cancer Father        lung,brain   ADD / ADHD Daughter    Heart attack Maternal Grandmother    Alzheimer's disease Paternal Grandmother    Allergies  Allergen Reactions   Aspirin Other (See Comments)    Hives, itching   Review of Systems  Constitutional:  Negative for chills and fever.  HENT:  Negative for sore throat.   Respiratory:  Negative for cough and shortness of breath.   Cardiovascular:  Negative  for chest pain, palpitations and leg swelling.  Gastrointestinal:  Negative for abdominal pain, blood in stool, constipation, diarrhea, nausea and vomiting.  Genitourinary:  Negative for dysuria and hematuria.  Musculoskeletal:  Negative for myalgias.  Skin:  Negative for itching and rash.  Neurological:  Negative for dizziness and headaches.  Psychiatric/Behavioral:  Negative for depression and suicidal ideas.      Objective:     BP 110/66   Pulse (!) 58   Ht '5\' 6"'$  (1.676 m)   Wt 177 lb (80.3 kg)   LMP 10/02/2015   SpO2 99%   BMI 28.57 kg/m  BP Readings from Last 3 Encounters:  04/04/22 110/66  03/07/22 112/70  08/26/21 (!) 82/59   Physical Exam Vitals reviewed.  Constitutional:      General: She is not  in acute distress.    Appearance: Normal appearance. She is not ill-appearing.  HENT:     Head: Normocephalic and atraumatic.     Right Ear: External ear normal.     Left Ear: External ear normal.     Nose: Nose normal. No congestion or rhinorrhea.     Mouth/Throat:     Mouth: Mucous membranes are moist.     Pharynx: Oropharynx is clear. No oropharyngeal exudate or posterior oropharyngeal erythema.  Eyes:     General: No scleral icterus.    Extraocular Movements: Extraocular movements intact.     Conjunctiva/sclera: Conjunctivae normal.     Pupils: Pupils are equal, round, and reactive to light.  Neck:     Comments: Surgical scar from thyroidectomy Cardiovascular:     Rate and Rhythm: Normal rate and regular rhythm.     Pulses: Normal pulses.     Heart sounds: Normal heart sounds.  Pulmonary:     Effort: Pulmonary effort is normal.     Breath sounds: Normal breath sounds. No wheezing, rhonchi or rales.  Abdominal:     General: Abdomen is flat. Bowel sounds are normal. There is no distension.     Palpations: Abdomen is soft. There is no mass.     Tenderness: There is no abdominal tenderness.  Musculoskeletal:        General: Normal range of motion.     Cervical back: Normal range of motion.     Right lower leg: No edema.     Left lower leg: No edema.  Lymphadenopathy:     Cervical: No cervical adenopathy.  Skin:    General: Skin is warm and dry.     Capillary Refill: Capillary refill takes less than 2 seconds.     Coloration: Skin is not jaundiced.  Neurological:     General: No focal deficit present.     Mental Status: She is alert and oriented to person, place, and time.  Psychiatric:        Mood and Affect: Mood normal.        Behavior: Behavior normal.      Assessment & Plan:   Problem List Items Addressed This Visit     GAD (generalized anxiety disorder)    Previously started on Zoloft 50 mg daily due to significant anxiety related to a complicated work  situation and additional life stressors.  She states that her anxiety has significantly improved since her last appointment. -No changes today.  Continue Zoloft 50 mg daily      Preventative health care    Seen today at follow-up. -She states that she will receive influenza and shingles vaccines at work next month.  She is also waiting for the new COVID-19 booster vaccine to be shipped to her work, and she will then receive the vaccine. -She is scheduled to undergo repeat mammogram on 10/27. -Reports that she recently had labs completed by her cardiologist in [place], [place].  We will review these records and plan to update labs at her next appointment.      Return in about 3 months (around 07/04/2022).    Johnette Abraham, MD

## 2022-04-04 NOTE — Assessment & Plan Note (Signed)
Previously started on Zoloft 50 mg daily due to significant anxiety related to a complicated work situation and additional life stressors.  She states that her anxiety has significantly improved since her last appointment. -No changes today.  Continue Zoloft 50 mg daily

## 2022-05-09 ENCOUNTER — Other Ambulatory Visit: Payer: Self-pay | Admitting: Adult Health

## 2022-07-04 ENCOUNTER — Ambulatory Visit (INDEPENDENT_AMBULATORY_CARE_PROVIDER_SITE_OTHER): Payer: Commercial Managed Care - PPO | Admitting: Internal Medicine

## 2022-07-04 ENCOUNTER — Encounter: Payer: Self-pay | Admitting: Internal Medicine

## 2022-07-04 VITALS — BP 100/70 | HR 79 | Ht 66.0 in | Wt 192.4 lb

## 2022-07-04 DIAGNOSIS — R200 Anesthesia of skin: Secondary | ICD-10-CM

## 2022-07-04 DIAGNOSIS — F411 Generalized anxiety disorder: Secondary | ICD-10-CM

## 2022-07-04 DIAGNOSIS — E611 Iron deficiency: Secondary | ICD-10-CM

## 2022-07-04 MED ORDER — SERTRALINE HCL 50 MG PO TABS: 100.0000 mg | ORAL_TABLET | Freq: Every day | ORAL | 1 refills | Status: DC

## 2022-07-04 NOTE — Patient Instructions (Signed)
It was a pleasure to see you today.  Thank you for giving [place] the opportunity to be involved in your care.  Below is a brief recap of your visit and next steps.  We will plan to see you again in 6 months.  Summary Increase Zoloft to 100 mg daily We will repeat iron studies today Follow up in 6 months

## 2022-07-04 NOTE — Progress Notes (Unsigned)
   Established Patient Office Visit  Subjective   Patient ID: Tina Middleton, female    DOB: [birth year]  Age: 56 y.o. MRN: [id number]  Chief Complaint  Patient presents with   Follow-up    On medication Zoloft.     HPI  Past Medical History:  Diagnosis Date   AICD (automatic cardioverter/defibrillator) present    Anemia    Anxiety    Breast cancer ([place])    Breast disorder    breast cancer   Cancer ([place])    Complication of anesthesia    PONV   Dysrhythmia    History of breast cancer in female 10/29/2015   Hot flashes 10/29/2015   Hyperlipidemia    Hypothyroidism    Irregular periods 10/29/2015   Peri-menopausal 10/29/2015   Personal history of chemotherapy 1999   Personal history of radiation therapy 1999   Recurrent cold sores    Thickened endometrium 03/04/2018   Needs endometrial biopsy, EEC was 9 mm   Thyroid disease    V tach ([place])    has a defib   Past Surgical History:  Procedure Laterality Date   BREAST BIOPSY Right 1999   Cancer   BREAST LUMPECTOMY Right [place]  05/07/2021   and replaced   CESAREAN SECTION     COLONOSCOPY N/A 06/23/2018   Procedure: COLONOSCOPY;  Surgeon: Rogene Houston, MD;  [facility];  Service: Endoscopy;  Laterality: N/A;  [place]  01/03/2021   THYROIDECTOMY     TONSILLECTOMY     TUBAL LIGATION     Social History   Tobacco Use   Smoking status: Never   Smokeless tobacco: Never  Vaping Use   Vaping Use: Never used  Substance Use Topics   Alcohol use: Yes    Alcohol/week: 1.0 standard drink of alcohol    Types: 1 Glasses of wine per week    Comment: occasional   Drug use: No   Family History  Problem Relation Age of Onset   Heart attack Mother    Cancer Father        lung,brain   ADD / ADHD Daughter    Heart attack Maternal Grandmother    Alzheimer's disease Paternal Grandmother    Allergies  Allergen Reactions   Aspirin  Other (See Comments)    Hives, itching      ROS    Objective:     BP 100/70 (BP Location: Left Arm, Patient Position: Sitting, Cuff Size: Normal)   Pulse 79   Ht '5\' 6"'$  (1.676 m)   Wt 192 lb 6.4 oz (87.3 kg)   LMP 10/02/2015   SpO2 97%   BMI 31.05 kg/m  BP Readings from Last 3 Encounters:  07/04/22 100/70  04/04/22 110/66  03/07/22 112/70      Physical Exam       Assessment & Plan:   Problem List Items Addressed This Visit   None   No follow-ups on file.    Johnette Abraham, MD

## 2022-07-05 LAB — IRON,TIBC AND FERRITIN PANEL
Ferritin: 258 ng/mL — ABNORMAL HIGH (ref 15–150)
Iron Saturation: 18 % (ref 15–55)
Iron: 60 ug/dL (ref 27–159)
Total Iron Binding Capacity: 326 ug/dL (ref 250–450)
UIBC: 266 ug/dL (ref 131–425)

## 2022-07-10 DIAGNOSIS — R200 Anesthesia of skin: Secondary | ICD-10-CM | POA: Insufficient documentation

## 2022-07-10 NOTE — Assessment & Plan Note (Signed)
Currently prescribed Zoloft 50 mg daily for treatment of anxiety.  She describes worsened anxiety recently and is interested in increasing Zoloft for improved control. -Increase Zoloft to 100 mg daily

## 2022-07-10 NOTE — Assessment & Plan Note (Signed)
Previous history of iron deficiency.  She has been off of iron supplementation for the past month. -Repeat iron studies ordered today

## 2022-07-10 NOTE — Assessment & Plan Note (Addendum)
Today she endorses a history of numbness in the distal aspect of the fifth digit of her left hand.  This developed after she cut accidentally cut the digit with a knife, which required suture repair.  On exam there is a discrepancy in sensation in the distal aspect of the digit.  ROM is generally intact. -At her request I have placed a referral to hand surgery for evaluation.  I have reviewed with her that there may not be any treatment options available.  She expressed understanding.

## 2022-07-29 ENCOUNTER — Telehealth: Payer: Self-pay | Admitting: Internal Medicine

## 2022-07-29 ENCOUNTER — Other Ambulatory Visit: Payer: Self-pay

## 2022-07-29 DIAGNOSIS — F411 Generalized anxiety disorder: Secondary | ICD-10-CM

## 2022-07-29 MED ORDER — SERTRALINE HCL 100 MG PO TABS: 100.0000 mg | ORAL_TABLET | Freq: Every day | ORAL | 0 refills | Status: DC

## 2022-07-29 NOTE — Telephone Encounter (Signed)
Refills sent

## 2022-07-29 NOTE — Telephone Encounter (Signed)
Patient called in for refill on   sertraline (ZOLOFT) 50 MG tablet  Patient has been taking 2 tabs to equal '100mg'$    Pt is need script for '100mg'$    [facility]

## 2022-10-22 ENCOUNTER — Other Ambulatory Visit: Payer: Self-pay | Admitting: Internal Medicine

## 2022-10-22 DIAGNOSIS — F411 Generalized anxiety disorder: Secondary | ICD-10-CM

## 2022-10-30 ENCOUNTER — Ambulatory Visit: Payer: Commercial Managed Care - PPO | Admitting: Adult Health

## 2022-12-15 ENCOUNTER — Ambulatory Visit: Payer: Commercial Managed Care - PPO | Admitting: Adult Health

## 2023-01-06 ENCOUNTER — Telehealth: Payer: Self-pay | Admitting: Internal Medicine

## 2023-01-06 MED ORDER — ACYCLOVIR 400 MG PO TABS: 400.0000 mg | ORAL_TABLET | Freq: Two times a day (BID) | ORAL | 1 refills | Status: DC

## 2023-01-06 NOTE — Telephone Encounter (Signed)
Pt called in requesting refill on acyclovir (ZOVIRAX) 400 MG tablet

## 2023-01-09 ENCOUNTER — Ambulatory Visit: Payer: Commercial Managed Care - PPO | Admitting: Internal Medicine

## 2023-01-13 ENCOUNTER — Ambulatory Visit (INDEPENDENT_AMBULATORY_CARE_PROVIDER_SITE_OTHER): Payer: Commercial Managed Care - PPO | Admitting: Adult Health

## 2023-01-13 ENCOUNTER — Encounter: Payer: Self-pay | Admitting: Adult Health

## 2023-01-13 VITALS — BP 99/65 | HR 61 | Ht 66.0 in | Wt 193.0 lb

## 2023-01-13 DIAGNOSIS — R232 Flushing: Secondary | ICD-10-CM | POA: Diagnosis not present

## 2023-01-13 DIAGNOSIS — Z853 Personal history of malignant neoplasm of breast: Secondary | ICD-10-CM | POA: Diagnosis not present

## 2023-01-13 DIAGNOSIS — Z01419 Encounter for gynecological examination (general) (routine) without abnormal findings: Secondary | ICD-10-CM

## 2023-01-13 DIAGNOSIS — Z1211 Encounter for screening for malignant neoplasm of colon: Secondary | ICD-10-CM

## 2023-01-13 LAB — HEMOCCULT GUIAC POC 1CARD (OFFICE): Fecal Occult Blood, POC: NEGATIVE

## 2023-01-13 NOTE — Progress Notes (Signed)
Patient ID: Tina Middleton, female   DOB: 1965/08/01, 57 y.o.   MRN: [id number] History of Present Illness: Tina Middleton is a 57 year old white female, divorced, PM in for a well woman gyn exam.  She is still working at [employer]).     Component Value Date/Time   DIAGPAP  07/16/2020 1446    - Negative for intraepithelial lesion or malignancy (NILM)   HPVHIGH Negative 07/16/2020 1446   ADEQPAP  07/16/2020 1446    Satisfactory for evaluation. The presence or absence of an   ADEQPAP  07/16/2020 1446    endocervical/transformation zone component cannot be determined because   ADEQPAP of atrophy. 07/16/2020 1446    PCP is Dr Durwin Nora.   Current Medications, Allergies, Past Medical History, Past Surgical History, Family History and Social History were reviewed in [employer] record.     Review of Systems: Patient denies any headaches, hearing loss, fatigue, blurred vision, shortness of breath, chest pain, abdominal pain, problems with bowel movements, urination, or intercourse(not active). No joint pain or mood swings.  Denies any vaginal bleeding, has about 5 hot flashes a day  Physical Exam:BP 99/65 (BP Location: Left Arm, Patient Position: Sitting, Cuff Size: Normal)   Pulse 61   Ht 5\' 6"  (1.676 m)   Wt 193 lb (87.5 kg)   LMP 10/02/2015   BMI 31.15 kg/m   General:  Well developed, well nourished, no acute distress Skin:  Warm and dry Neck:  Midline trachea, normal thyroid, good ROM, no lymphadenopathy Lungs; Clear to auscultation bilaterally Breast:  No dominant palpable mass, retraction, or nipple discharge Cardiovascular: Regular rate and rhythm Abdomen:  Soft, non tender, no hepatosplenomegaly Pelvic:  External genitalia is normal in appearance, no lesions.  The vagina is normal in appearance. Urethra has no lesions or masses. The cervix is smooth.  Uterus is felt to be normal size, shape, and contour.  No adnexal masses or tenderness noted.Bladder is non tender,  no masses felt. Rectal: Good sphincter tone, no polyps,+ hemorrhoids felt.  Hemoccult negative. Extremities/musculoskeletal:  No swelling or varicosities noted, no clubbing or cyanosis Psych:  No mood changes, alert and cooperative,seems happy AA is 1 Fall risk is moderate    01/13/2023   10:50 AM 07/04/2022    1:24 PM 04/04/2022    1:48 PM  Depression screen PHQ 2/9  Decreased Interest 0 0 0  Down, Depressed, Hopeless 0 0 0  PHQ - 2 Score 0 0 0  Altered sleeping 0    Tired, decreased energy 0    Change in appetite 0    Feeling bad or failure about yourself  0    Trouble concentrating 0    Moving slowly or fidgety/restless 0    Suicidal thoughts 0    PHQ-9 Score 0         01/13/2023   10:51 AM 08/26/2021   11:41 AM 07/16/2020    2:44 PM  GAD 7 : Generalized Anxiety Score  Nervous, Anxious, on Edge 0 0 0  Control/stop worrying 1 1 0  Worry too much - different things 1 1 0  Trouble relaxing 1 1 0  Restless 0 0 0  Easily annoyed or irritable 0 0 0  Afraid - awful might happen 0 1 0  Total GAD 7 Score 3 4 0      Upstream - 01/13/23 1056       Pregnancy Intention Screening   Does the patient want to become pregnant in the  next year? N/A    Does the patient's partner want to become pregnant in the next year? N/A    Would the patient like to discuss contraceptive options today? N/A      Contraception Wrap Up   Current Method Female Sterilization    End Method Female Sterilization    Contraception Counseling Provided No            Examination chaperoned by Malachy Mood LPN   Impression and Plan: 1. Encounter for well woman exam with routine gynecological exam Pap and physical in 1 year Labs with cardiology Colonoscopy per GI   2. Encounter for screening fecal occult blood testing Hemoccult was negative  - POCT occult blood stool  3. History of breast cancer in female She had negative mammogram at [facility] 05/02/22  4. Hot flashes Not as bad, about 5 a  day

## 2023-01-19 ENCOUNTER — Other Ambulatory Visit: Payer: Self-pay | Admitting: Internal Medicine

## 2023-01-19 DIAGNOSIS — F411 Generalized anxiety disorder: Secondary | ICD-10-CM

## 2023-02-06 ENCOUNTER — Ambulatory Visit (INDEPENDENT_AMBULATORY_CARE_PROVIDER_SITE_OTHER): Payer: Commercial Managed Care - PPO | Admitting: Internal Medicine

## 2023-02-06 ENCOUNTER — Encounter: Payer: Self-pay | Admitting: Internal Medicine

## 2023-02-06 VITALS — BP 105/70 | HR 68 | Ht 66.0 in | Wt 192.8 lb

## 2023-02-06 DIAGNOSIS — E039 Hypothyroidism, unspecified: Secondary | ICD-10-CM

## 2023-02-06 DIAGNOSIS — B001 Herpesviral vesicular dermatitis: Secondary | ICD-10-CM

## 2023-02-06 DIAGNOSIS — F411 Generalized anxiety disorder: Secondary | ICD-10-CM | POA: Diagnosis not present

## 2023-02-06 DIAGNOSIS — E7800 Pure hypercholesterolemia, unspecified: Secondary | ICD-10-CM

## 2023-02-06 DIAGNOSIS — I4729 Other ventricular tachycardia: Secondary | ICD-10-CM

## 2023-02-06 MED ORDER — ACYCLOVIR 400 MG PO TABS: 400.0000 mg | ORAL_TABLET | Freq: Two times a day (BID) | ORAL | 1 refills | Status: DC

## 2023-02-06 NOTE — Assessment & Plan Note (Signed)
Acyclovir refilled today 

## 2023-02-06 NOTE — Assessment & Plan Note (Signed)
She reports today that her mood is stable and anxiety well-controlled with sertraline 100 mg daily.  No additional medication changes are indicated today.

## 2023-02-06 NOTE — Patient Instructions (Signed)
It was a pleasure to see you today.  Thank you for giving [place] the opportunity to be involved in your care.  Below is a brief recap of your visit and next steps.  We will plan to see you again in 6 months.  Summary No medication changes today I am glad to see that things are going well Follow up in 6 months

## 2023-02-06 NOTE — Assessment & Plan Note (Signed)
She continues to take atorvastatin 10 mg daily.  Labs will be repeated by her cardiologist this fall.

## 2023-02-06 NOTE — Assessment & Plan Note (Signed)
Followed by Dr. Rockne Menghini in [place], [place].  She continues to take metoprolol and sotalol.  S/p AICD.  She remains asymptomatic.

## 2023-02-06 NOTE — Assessment & Plan Note (Addendum)
She is currently prescribed levothyroxine 112 mcg daily.  Remains asymptomatic.  TSH/T4 WNL when last checked.  Labs will be repeated this fall by her cardiologist.

## 2023-02-06 NOTE — Progress Notes (Signed)
Established Patient Office Visit  Subjective   Patient ID: Tina Middleton, female    DOB: 1965-11-05  Age: 57 y.o. MRN: [id number]  Chief Complaint  Patient presents with   gad    Follow up 6 month    Tina Middleton returns to care today for routine follow-up.  She was last evaluated by me on 07/04/22.  At that time she endorsed poorly controlled symptoms of anxiety and requested increase sertraline to 100 mg daily.  She was also referred to hand surgery for evaluation in the setting of numbness at the distal aspect of the fifth digit of her left hand.  In the interim, she reports that she had pneumonia in April.  She was also seen by gynecology last month for her annual exam.  There have otherwise been no acute interval events. Tina Middleton reports feeling well today.  She is asymptomatic and has no acute concerns to discuss.  Past Medical History:  Diagnosis Date   AICD (automatic cardioverter/defibrillator) present    Anemia    Anxiety    Breast cancer ([facility])    Breast disorder    breast cancer   Cancer ([facility])    Complication of anesthesia    PONV   Dysrhythmia    History of breast cancer in female 10/29/2015   Hot flashes 10/29/2015   Hyperlipidemia    Hypothyroidism    Irregular periods 10/29/2015   Peri-menopausal 10/29/2015   Personal history of chemotherapy 1999   Personal history of radiation therapy 1999   Recurrent cold sores    Thickened endometrium 03/04/2018   Needs endometrial biopsy, EEC was 9 mm   Thyroid disease    V tach ([facility])    has a defib   Past Surgical History:  Procedure Laterality Date   BREAST BIOPSY Right 1999   Cancer   BREAST LUMPECTOMY Right 1999   CARDIAC DEFIBRILLATOR PLACEMENT     CARDIAC DEFIBRILLATOR REMOVAL  05/07/2021   and replaced   CESAREAN SECTION     COLONOSCOPY N/A 06/23/2018   Procedure: COLONOSCOPY;  Surgeon: Malissa Hippo, MD;  Location: [facility];  Service: Endoscopy;  Laterality: N/A;  730   GALLBLADDER SURGERY  01/03/2021    THYROIDECTOMY     TONSILLECTOMY     TUBAL LIGATION     Social History   Tobacco Use   Smoking status: Never   Smokeless tobacco: Never  Vaping Use   Vaping status: Never Used  Substance Use Topics   Alcohol use: Yes    Alcohol/week: 1.0 standard drink of alcohol    Types: 1 Glasses of wine per week    Comment: occasional   Drug use: No   Family History  Problem Relation Age of Onset   Heart attack Mother    Cancer Father        lung,brain   ADD / ADHD Daughter    Heart attack Maternal Grandmother    Alzheimer's disease Paternal Grandmother    Allergies  Allergen Reactions   Aspirin Other (See Comments)    Hives, itching   Review of Systems  Constitutional:  Negative for chills and fever.  HENT:  Negative for sore throat.   Respiratory:  Negative for cough and shortness of breath.   Cardiovascular:  Negative for chest pain, palpitations and leg swelling.  Gastrointestinal:  Negative for abdominal pain, blood in stool, constipation, diarrhea, nausea and vomiting.  Genitourinary:  Negative for dysuria and hematuria.  Musculoskeletal:  Negative for myalgias.  Skin:  Negative for itching and rash.  Neurological:  Negative for dizziness and headaches.  Psychiatric/Behavioral:  Negative for depression and suicidal ideas.      Objective:     BP 105/70   Pulse 68   Ht 5\' 6"  (1.676 m)   Wt 192 lb 12.8 oz (87.5 kg)   LMP 10/02/2015   SpO2 95%   BMI 31.12 kg/m  BP Readings from Last 3 Encounters:  02/06/23 105/70  01/13/23 99/65  07/04/22 100/70   Physical Exam Vitals reviewed.  Constitutional:      General: She is not in acute distress.    Appearance: Normal appearance. She is not ill-appearing.  HENT:     Head: Normocephalic and atraumatic.     Right Ear: External ear normal.     Left Ear: External ear normal.     Nose: Nose normal. No congestion or rhinorrhea.     Mouth/Throat:     Mouth: Mucous membranes are moist.     Pharynx: Oropharynx is clear.  No oropharyngeal exudate or posterior oropharyngeal erythema.  Eyes:     General: No scleral icterus.    Extraocular Movements: Extraocular movements intact.     Conjunctiva/sclera: Conjunctivae normal.     Pupils: Pupils are equal, round, and reactive to light.  Neck:     Comments: Surgical scar from thyroidectomy Cardiovascular:     Rate and Rhythm: Normal rate and regular rhythm.     Pulses: Normal pulses.     Heart sounds: Normal heart sounds.  Pulmonary:     Effort: Pulmonary effort is normal.     Breath sounds: Normal breath sounds. No wheezing, rhonchi or rales.  Abdominal:     General: Abdomen is flat. Bowel sounds are normal. There is no distension.     Palpations: Abdomen is soft. There is no mass.     Tenderness: There is no abdominal tenderness.  Musculoskeletal:        General: Normal range of motion.     Cervical back: Normal range of motion.     Right lower leg: No edema.     Left lower leg: No edema.  Lymphadenopathy:     Cervical: No cervical adenopathy.  Skin:    General: Skin is warm and dry.     Capillary Refill: Capillary refill takes less than 2 seconds.     Coloration: Skin is not jaundiced.  Neurological:     General: No focal deficit present.     Mental Status: She is alert and oriented to person, place, and time.  Psychiatric:        Mood and Affect: Mood normal.        Behavior: Behavior normal.      Assessment & Plan:   Problem List Items Addressed This Visit       Paroxysmal VT (H[facility]) - Primary    Followed by Dr. Rockne Menghini in [place], [place].  She continues to take metoprolol and sotalol.  S/p AICD.  She remains asymptomatic.      Recurrent cold sores    Acyclovir refilled today.      Hypothyroidism (acquired)    She is currently prescribed levothyroxine 112 mcg daily.  Remains asymptomatic.  TSH/T4 WNL when last checked.  Labs will be repeated this fall by her cardiologist.      GAD (generalized anxiety disorder)    She reports today  that her mood is stable and anxiety well-controlled with sertraline 100 mg daily.  No additional medication changes  are indicated today.      Hypercholesterolemia    She continues to take atorvastatin 10 mg daily.  Labs will be repeated by her cardiologist this fall.       Return in about 6 months (around 08/09/2023).    Billie Lade, MD

## 2023-04-09 ENCOUNTER — Other Ambulatory Visit: Payer: Self-pay | Admitting: Adult Health

## 2023-05-15 ENCOUNTER — Other Ambulatory Visit: Payer: Self-pay | Admitting: Internal Medicine

## 2023-05-15 DIAGNOSIS — F411 Generalized anxiety disorder: Secondary | ICD-10-CM

## 2023-05-17 ENCOUNTER — Other Ambulatory Visit: Payer: Self-pay | Admitting: Adult Health

## 2023-06-16 ENCOUNTER — Other Ambulatory Visit: Payer: Self-pay

## 2023-06-16 ENCOUNTER — Telehealth: Payer: Self-pay | Admitting: Internal Medicine

## 2023-06-16 MED ORDER — UNABLE TO FIND: 1.0000 | Freq: Once | 0 refills | Status: AC

## 2023-06-16 NOTE — Telephone Encounter (Signed)
Faxed to [employer]

## 2023-06-16 NOTE — Telephone Encounter (Signed)
Copied from CRM [id number]. Topic: General - Other >> Jun 16, 2023  1:33 PM Colletta Maryland S wrote: Reason for CRM: Pt needs order from pcp for pneumonia vaccine faxed over to [facility] [employer], fax number is [phone]

## 2023-07-15 ENCOUNTER — Telehealth: Payer: Self-pay

## 2023-07-15 NOTE — Telephone Encounter (Signed)
 Lmtrc , dr Durwin Nora out of the office until Monday

## 2023-07-15 NOTE — Telephone Encounter (Signed)
 Copied from CRM [id number]. Topic: Clinical - Medication Question >> Jul 14, 2023  4:06 PM Bascom RAMAN wrote: Reason for CRM: Flying next week, and wants to see if you can prescribe some Xanax 0.5. She does not need a lot, just enough for the flights. Callback number [phone]

## 2023-07-20 NOTE — Telephone Encounter (Signed)
 Tina Middleton

## 2023-07-21 ENCOUNTER — Telehealth: Payer: Self-pay

## 2023-07-21 ENCOUNTER — Telehealth: Payer: Self-pay | Admitting: Internal Medicine

## 2023-07-21 NOTE — Telephone Encounter (Signed)
 Error

## 2023-07-21 NOTE — Telephone Encounter (Signed)
 Patient unable to take only available appt. Says she'll just have to go without it.

## 2023-07-21 NOTE — Telephone Encounter (Signed)
 Copied from CRM [id number]. Topic: Clinical - Medication Question >> Jul 21, 2023  3:05 PM Ivette P wrote: Reason for CRM: Pt called in to get an update on question for DR. Dixon. Called CAL nurse Powell was with a pt, pt requesting a call back at [phone].

## 2023-08-04 ENCOUNTER — Encounter (HOSPITAL_COMMUNITY): Payer: Self-pay

## 2023-08-04 ENCOUNTER — Emergency Department (HOSPITAL_COMMUNITY): Payer: Commercial Managed Care - PPO

## 2023-08-04 ENCOUNTER — Emergency Department (HOSPITAL_COMMUNITY)
Admission: EM | Admit: 2023-08-04 | Discharge: 2023-08-05 | Disposition: A | Discharge status: Home / Self Care | Payer: Commercial Managed Care - PPO | Attending: Emergency Medicine | Admitting: Emergency Medicine

## 2023-08-04 ENCOUNTER — Other Ambulatory Visit: Payer: Self-pay

## 2023-08-04 DIAGNOSIS — S8992XA Unspecified injury of left lower leg, initial encounter: Secondary | ICD-10-CM | POA: Diagnosis present

## 2023-08-04 DIAGNOSIS — S93401A Sprain of unspecified ligament of right ankle, initial encounter: Secondary | ICD-10-CM | POA: Diagnosis not present

## 2023-08-04 DIAGNOSIS — Y9301 Activity, walking, marching and hiking: Secondary | ICD-10-CM | POA: Insufficient documentation

## 2023-08-04 DIAGNOSIS — W108XXA Fall (on) (from) other stairs and steps, initial encounter: Secondary | ICD-10-CM | POA: Insufficient documentation

## 2023-08-04 DIAGNOSIS — S82832A Other fracture of upper and lower end of left fibula, initial encounter for closed fracture: Secondary | ICD-10-CM | POA: Diagnosis not present

## 2023-08-04 DIAGNOSIS — Z79899 Other long term (current) drug therapy: Secondary | ICD-10-CM | POA: Insufficient documentation

## 2023-08-04 NOTE — ED Notes (Signed)
Ice applied to bilateral ankles.

## 2023-08-04 NOTE — ED Triage Notes (Signed)
Pt tripped on her steps and injured both ankles.

## 2023-08-05 MED ORDER — HYDROCODONE-ACETAMINOPHEN 5-325 MG PO TABS: 1.0000 | ORAL_TABLET | Freq: Four times a day (QID) | ORAL | 0 refills | Status: DC | PRN

## 2023-08-05 MED ORDER — HYDROCODONE-ACETAMINOPHEN 5-325 MG PO TABS
1.0000 | ORAL_TABLET | Freq: Once | ORAL | Status: AC
  Administered 2023-08-05: 1 via ORAL
  Filled 2023-08-05: qty 1

## 2023-08-05 NOTE — ED Provider Notes (Signed)
[place] EMERGENCY DEPARTMENT AT [facility]  Provider Note  CSN: [id number] Arrival date & time: 08/04/23 1936  History Chief Complaint  Patient presents with   Marletta Lor    Tina Middleton is a 58 y.o. female reports she fell walking down her steps earlier tonight injuring both ankles, L>R. No other injuries.    Home Medications Prior to Admission medications   Medication Sig Start Date End Date Taking? Authorizing Provider  HYDROcodone-acetaminophen (NORCO/VICODIN) 5-325 MG tablet Take 1 tablet by mouth every 6 (six) hours as needed for severe pain (pain score 7-10). 08/05/23  Yes Pollyann Savoy, MD  acyclovir (ZOVIRAX) 400 MG tablet Take 1 tablet (400 mg total) by mouth 2 (two) times daily. 02/06/23   Billie Lade, MD  atorvastatin (LIPITOR) 10 MG tablet Take 10 mg by mouth See admin instructions. Takes 1 tab on Mondays and Thursdays    [provider]  CALCIUM PO Take 2 tablets by mouth at bedtime. Takes 2 at bedtime    [provider]  fenofibrate 160 MG tablet Take 160 mg by mouth at bedtime. 05/28/14   [provider]  ferrous sulfate 325 (65 FE) MG tablet Take 325 mg by mouth daily with breakfast.    [provider]  hydrOXYzine (ATARAX) 10 MG tablet Take 1 tablet by mouth three times daily as needed 05/18/23   Adline Potter, NP  levothyroxine (SYNTHROID, LEVOTHROID) 112 MCG tablet Take 112 mcg by mouth daily before breakfast.  08/14/14   [provider]  magnesium oxide (MAG-OX) 400 MG tablet Take 400 mg by mouth daily.     [provider]  metoprolol tartrate (LOPRESSOR) 25 MG tablet Take 25 mg by mouth 2 (two) times daily. 08/14/14   [provider]  potassium chloride SA (K-DUR,KLOR-CON) 20 MEQ tablet Take 20 mEq by mouth daily. 05/28/14   [provider]  sertraline (ZOLOFT) 100 MG tablet Take 1 tablet by mouth once daily 05/15/23   Billie Lade, MD  sotalol (BETAPACE) 120 MG tablet  Take 180 mg by mouth 2 (two) times daily. 1.5 tabs twice daily 08/06/14   [provider]     Allergies    Aspirin   Review of Systems   Review of Systems Please see HPI for pertinent positives and negatives  Physical Exam BP 122/74 (BP Location: Left Arm)   Pulse 66   Temp 98.7 F (37.1 C) (Oral)   Resp 16   Ht 5\' 6"  (1.676 m)   Wt 86.2 kg   LMP 10/02/2015   SpO2 95%   BMI 30.67 kg/m   Physical Exam Vitals and nursing note reviewed.  HENT:     Head: Normocephalic.     Nose: Nose normal.  Eyes:     Extraocular Movements: Extraocular movements intact.  Cardiovascular:     Pulses: Normal pulses.  Pulmonary:     Effort: Pulmonary effort is normal.  Musculoskeletal:        General: Swelling and tenderness (L lateral ankle) present. No deformity.     Cervical back: Neck supple.  Skin:    Findings: No rash (on exposed skin).  Neurological:     Mental Status: She is alert and oriented to person, place, and time.  Psychiatric:        Mood and Affect: Mood normal.     ED Results / Procedures / Treatments   EKG None  Procedures Procedures  Medications Ordered in the ED Medications  HYDROcodone-acetaminophen (NORCO/VICODIN) 5-325 MG per tablet 1 tablet (has no administration in time range)    Initial Impression and Plan  Patient here with bilateral ankle injuries. I personally viewed the images from radiology studies and agree with radiologist interpretation: R ankle is negative, likely a sprain. L ankle has a nondisplaced distal fibular fracture. Will place in Cam boot on left and ASO on right. Crutches as needed. Norco for pain and Ortho follow up.   ED Course       MDM Rules/Calculators/A&P Medical Decision Making Problems Addressed: Closed fracture of distal end of left fibula, unspecified fracture morphology, initial encounter: acute illness or injury Sprain of right ankle, unspecified ligament, initial encounter: acute illness or  injury  Amount and/or Complexity of Data Reviewed Radiology: ordered and independent interpretation performed. Decision-making details documented in ED Course.  Risk Prescription drug management.     Final Clinical Impression(s) / ED Diagnoses Final diagnoses:  Sprain of right ankle, unspecified ligament, initial encounter  Closed fracture of distal end of left fibula, unspecified fracture morphology, initial encounter    Rx / DC Orders ED Discharge Orders          Ordered    HYDROcodone-acetaminophen (NORCO/VICODIN) 5-325 MG tablet  Every 6 hours PRN        08/05/23 0044             Pollyann Savoy, MD 08/05/23 0045

## 2023-08-12 ENCOUNTER — Other Ambulatory Visit: Payer: Self-pay | Admitting: Internal Medicine

## 2023-08-12 DIAGNOSIS — F411 Generalized anxiety disorder: Secondary | ICD-10-CM

## 2023-08-14 ENCOUNTER — Ambulatory Visit: Payer: Commercial Managed Care - PPO | Admitting: Internal Medicine

## 2023-08-17 ENCOUNTER — Ambulatory Visit
Admission: RE | Admit: 2023-08-17 | Discharge: 2023-08-17 | Disposition: A | Discharge status: Home / Self Care | Payer: Commercial Managed Care - PPO | Source: Ambulatory Visit | Attending: Orthopaedic Surgery

## 2023-08-17 ENCOUNTER — Other Ambulatory Visit: Payer: Self-pay | Admitting: Orthopaedic Surgery

## 2023-08-17 DIAGNOSIS — S8262XA Displaced fracture of lateral malleolus of left fibula, initial encounter for closed fracture: Secondary | ICD-10-CM

## 2023-11-11 ENCOUNTER — Other Ambulatory Visit: Payer: Self-pay | Admitting: Internal Medicine

## 2023-11-11 DIAGNOSIS — F411 Generalized anxiety disorder: Secondary | ICD-10-CM

## 2024-01-01 ENCOUNTER — Ambulatory Visit

## 2024-01-01 VITALS — BP 102/69 | HR 75 | Ht 66.0 in | Wt 200.1 lb

## 2024-01-01 DIAGNOSIS — F411 Generalized anxiety disorder: Secondary | ICD-10-CM

## 2024-01-01 DIAGNOSIS — B001 Herpesviral vesicular dermatitis: Secondary | ICD-10-CM | POA: Diagnosis not present

## 2024-01-01 MED ORDER — ACYCLOVIR 400 MG PO TABS: 400.0000 mg | ORAL_TABLET | Freq: Two times a day (BID) | ORAL | 3 refills | Status: AC

## 2024-01-01 MED ORDER — HYDROXYZINE HCL 10 MG PO TABS: 10.0000 mg | ORAL_TABLET | Freq: Three times a day (TID) | ORAL | 5 refills | Status: AC | PRN

## 2024-01-01 MED ORDER — SERTRALINE HCL 100 MG PO TABS: 100.0000 mg | ORAL_TABLET | Freq: Every day | ORAL | 1 refills | Status: AC

## 2024-01-01 NOTE — Progress Notes (Unsigned)
   Established Patient Office Visit  Subjective   Patient ID: Tina Middleton, female    DOB: Nov 28, 1965  Age: 57 y.o. MRN: [id number]  Chief Complaint  Patient presents with   Medical Management of Chronic Issues    Pt states here for med refills and dizziness thinks it has something to do with her ears.    HPI  Patient Active Problem List   Diagnosis Date Noted   Recurrent cold sores 02/06/2023   Numbness of finger 07/10/2022   Paroxysmal VT (HCC) 03/12/2022   GAD (generalized anxiety disorder) 03/12/2022   Iron deficiency 03/12/2022   Hypercholesterolemia 03/12/2022   Hypothyroidism (acquired) 03/12/2022   Preventative health care 03/12/2022   Encounter for screening fecal occult blood testing 07/16/2020   Encounter for gynecological examination with Papanicolaou smear of cervix 07/16/2020   Sleep disturbance 07/16/2020   Thickened endometrium 03/04/2018   Special screening for malignant neoplasms, colon 01/18/2018   RLQ abdominal pain 11/09/2017   Screening for colorectal cancer 11/09/2017   Encounter for well woman exam with routine gynecological exam 11/09/2017   Peri-menopausal 10/29/2015   Hot flashes 10/29/2015   Irregular periods 10/29/2015   History of breast cancer in female 10/29/2015    ROS    Objective:     BP 102/69   Pulse 75   Ht 5' 6 (1.676 m)   Wt 200 lb 1.9 oz (90.8 kg)   LMP 10/02/2015   SpO2 96%   BMI 32.30 kg/m  BP Readings from Last 3 Encounters:  01/01/24 102/69  08/05/23 113/67  02/06/23 105/70   Wt Readings from Last 3 Encounters:  01/01/24 200 lb 1.9 oz (90.8 kg)  08/04/23 190 lb (86.2 kg)  02/06/23 192 lb 12.8 oz (87.5 kg)      Physical Exam Vitals and nursing note reviewed.  Constitutional:      Appearance: Normal appearance.   Eyes:     Extraocular Movements: Extraocular movements intact.     Pupils: Pupils are equal, round, and reactive to light.    Cardiovascular:     Rate and Rhythm: Normal rate and regular  rhythm.  Pulmonary:     Effort: Pulmonary effort is normal.     Breath sounds: Normal breath sounds.   Musculoskeletal:     Cervical back: Normal range of motion and neck supple.   Neurological:     Mental Status: She is alert and oriented to person, place, and time.   Psychiatric:        Mood and Affect: Mood normal.        Thought Content: Thought content normal.     No results found for any visits on 01/01/24.  {Labs (Optional):23779}  The ASCVD Risk score (Arnett DK, et al., 2019) failed to calculate for the following reasons:   Cannot find a previous HDL lab   Cannot find a previous total cholesterol lab    Assessment & Plan:   Problem List Items Addressed This Visit   None   No follow-ups on file.    Leita Longs, FNP

## 2024-01-04 NOTE — Assessment & Plan Note (Signed)
She reports today that her mood is stable and anxiety well-controlled with sertraline 100 mg daily.  No additional medication changes are indicated today.

## 2024-01-04 NOTE — Assessment & Plan Note (Signed)
Acyclovir refilled today 

## 2024-05-12 ENCOUNTER — Other Ambulatory Visit (HOSPITAL_COMMUNITY)
Admission: RE | Admit: 2024-05-12 | Discharge: 2024-05-12 | Disposition: A | Source: Ambulatory Visit | Attending: Adult Health | Admitting: Adult Health

## 2024-05-12 ENCOUNTER — Encounter: Payer: Self-pay | Admitting: Adult Health

## 2024-05-12 ENCOUNTER — Ambulatory Visit (INDEPENDENT_AMBULATORY_CARE_PROVIDER_SITE_OTHER): Admitting: Adult Health

## 2024-05-12 VITALS — BP 111/75 | HR 76 | Ht 66.0 in | Wt 198.5 lb

## 2024-05-12 DIAGNOSIS — Z01419 Encounter for gynecological examination (general) (routine) without abnormal findings: Secondary | ICD-10-CM

## 2024-05-12 DIAGNOSIS — Z853 Personal history of malignant neoplasm of breast: Secondary | ICD-10-CM | POA: Diagnosis not present

## 2024-05-12 DIAGNOSIS — Z1331 Encounter for screening for depression: Secondary | ICD-10-CM

## 2024-05-12 DIAGNOSIS — Z1151 Encounter for screening for human papillomavirus (HPV): Secondary | ICD-10-CM | POA: Diagnosis not present

## 2024-05-12 NOTE — Progress Notes (Signed)
 Patient ID: Tina Middleton, female   DOB: 1965-12-07, 58 y.o.   MRN: [id number] History of Present Illness: Tina Middleton is a 58 year old white female, divorced, PM in for a well woman gyn exam and pap. She is still working at [employer]. She fell in January and had left lower leg fracture and ankle dislocation.   PCP is Leita Longs NP   Current Medications, Allergies, Past Medical History, Past Surgical History, Family History and Social History were reviewed in [employer] record.     Review of Systems: Patient denies any headaches, hearing loss, fatigue, blurred vision, shortness of breath, chest pain, abdominal pain, problems with bowel movements, urination, or intercourse(not active). No joint pain or mood swings.     Physical Exam:BP 111/75 (BP Location: Left Arm, Patient Position: Sitting, Cuff Size: Normal)   Pulse 76   Ht 5' 6 (1.676 m)   Wt 198 lb 8 oz (90 kg)   LMP 10/02/2015   BMI 32.04 kg/m   General:  Well developed, well nourished, no acute distress Skin:  Warm and dry Neck:  Midline trachea, normal thyroid , good ROM, no lymphadenopathy Lungs; Clear to auscultation bilaterally Breast:  No dominant palpable mass, retraction, or nipple discharge Cardiovascular: Regular rate and rhythm Abdomen:  Soft, non tender, no hepatosplenomegaly Pelvic:  External genitalia is normal in appearance, no lesions.  The vagina is pale. Urethra has no lesions or masses. The cervix is smooth, pap with HR HPV genotyping performed.  Uterus is felt to be normal size, shape, and contour.  No adnexal masses or tenderness noted.Bladder is non tender, no masses felt. Rectal:Deferred Extremities/musculoskeletal:  No swelling or varicosities noted, no clubbing or cyanosis Psych:  No mood changes, alert and cooperative,seems happy AA is 1 Fall risk is moderate    05/12/2024    2:14 PM 01/01/2024    3:21 PM 02/06/2023    2:06 PM  Depression screen PHQ 2/9  Decreased Interest 0 0  0  Down, Depressed, Hopeless 0 0 0  PHQ - 2 Score 0 0 0  Altered sleeping 0 0 0  Tired, decreased energy 0 0 0  Change in appetite 0 0 0  Feeling bad or failure about yourself  0 0 0  Trouble concentrating 0 0 0  Moving slowly or fidgety/restless 0 0 0  Suicidal thoughts 0 0 0  PHQ-9 Score 0 0  0   Difficult doing work/chores  Not difficult at all      Data saved with a previous flowsheet row definition       05/12/2024    2:14 PM 01/01/2024    3:21 PM 02/06/2023    2:06 PM 01/13/2023   10:51 AM  GAD 7 : Generalized Anxiety Score  Nervous, Anxious, on Edge 0 1 0 0  Control/stop worrying 0 1 1 1   Worry too much - different things 0 1 1 1   Trouble relaxing 0 1 0 1  Restless 0 0 0 0  Easily annoyed or irritable 0 0 0 0  Afraid - awful might happen 0 0 0 0  Total GAD 7 Score 0 4 2 3   Anxiety Difficulty  Not difficult at all      Upstream - 05/12/24 1411       Pregnancy Intention Screening   Does the patient want to become pregnant in the next year? N/A    Does the patient's partner want to become pregnant in the next year? N/A  Would the patient like to discuss contraceptive options today? N/A      Contraception Wrap Up   Current Method Abstinence;Female Sterilization;Post-Menopause    End Method Abstinence;Female Sterilization;Post-Menopause    Contraception Counseling Provided No           Examination chaperoned by Clarita Salt LPN  Impression and plan: 1. Encounter for gynecological examination with Papanicolaou smear of cervix (Primary) Pap sent Pap in 3 years if negative Physical in 1 year Labs with PCP Mammogram at [facility] in near future, was negative 05/02/23 Colonoscopy per GI  Stay active  - Cytology - PAP( [place])  2. History of breast cancer in female

## 2024-05-16 ENCOUNTER — Ambulatory Visit: Payer: Self-pay | Admitting: Adult Health

## 2024-05-16 LAB — CYTOLOGY - PAP
Comment: NEGATIVE
Diagnosis: NEGATIVE
High risk HPV: NEGATIVE
